# Patient Record
Sex: Male | Born: 1975
Health system: Southern US, Community
[De-identification: ages and names within clinical notes are randomized; demographics above are authoritative.]

## PROBLEM LIST (undated history)

## (undated) DIAGNOSIS — M109 Gout, unspecified: Secondary | ICD-10-CM

## (undated) HISTORY — PX: SCROTUM EXPLORATION: SHX2389

---

## 2014-01-06 ENCOUNTER — Ambulatory Visit (INDEPENDENT_AMBULATORY_CARE_PROVIDER_SITE_OTHER): Payer: Commercial Managed Care - PPO | Admitting: Emergency Medicine

## 2014-01-06 VITALS — BP 118/80 | HR 105 | Temp 98.5°F | Resp 20 | Ht 71.0 in | Wt 170.0 lb

## 2014-01-06 DIAGNOSIS — M10072 Idiopathic gout, left ankle and foot: Secondary | ICD-10-CM

## 2014-01-06 DIAGNOSIS — M109 Gout, unspecified: Secondary | ICD-10-CM | POA: Insufficient documentation

## 2014-01-06 MED ORDER — INDOMETHACIN 25 MG PO CAPS: 25.0000 mg | ORAL_CAPSULE | Freq: Three times a day (TID) | ORAL | Status: DC

## 2014-01-06 MED ORDER — COLCHICINE 0.6 MG PO TABS: ORAL_TABLET | ORAL | Status: DC

## 2014-01-06 NOTE — Progress Notes (Signed)
[facility] [address] [place] [address] [phone]  Date:  01/06/2014   Name:  Richard Olson   DOB:  12-17-1975   MRN:  [id number]  PCP:  No primary care provider on file.    Chief Complaint: Foot Pain   History of Present Illness:  Richard Olson is a 39 y.o. very pleasant male patient who presents with the following:  Patient has a history of gout.  Last episode was 10 years ago.  Interval negative Now has pain and swelling of left ankle and midfoot. No history of injury or overuse Pain started Friday. No fever or chills No improvement with over the counter medications or other home remedies.  Denies other complaint or health concern today.   Patient Active Problem List   Diagnosis Date Noted  . Gout 01/06/2014    History reviewed. No pertinent past medical history.  History reviewed. No pertinent past surgical history.  History  Substance Use Topics  . Smoking status: Never Smoker   . Smokeless tobacco: Not on file  . Alcohol Use: 0.0 oz/week    0 Not specified per week     Comment: 3 drinks a week    Family History  Problem Relation Age of Onset  . Diabetes Father     No Known Allergies  Medication list has been reviewed and updated.  No current outpatient prescriptions on file prior to visit.   No current facility-administered medications on file prior to visit.    Review of Systems:  As per HPI, otherwise negative.    Physical Examination: Filed Vitals:   01/06/14 1509  BP: 118/80  Pulse: 105  Temp: 98.5 F (36.9 C)  Resp: 20   Filed Vitals:   01/06/14 1509  Height:  (1.803 m)  Weight: 170 lb (77.111 kg)   Body mass index is 23.72 kg/(m^2). Ideal Body Weight: Weight in (lb) to have BMI = 25: 178.9   GEN: WDWN, NAD, Non-toxic, Alert & Oriented x 3 HEENT: Atraumatic, Normocephalic.  Ears and Nose: No external deformity. EXTR: No clubbing/cyanosis/edema NEURO: Normal gait.  PSYCH: Normally  interactive. Conversant. Not depressed or anxious appearing.  Calm demeanor.  Left foot swollen ankle and midfoot.  No erythema.  Some tenderness.  Assessment and Plan: Gout colcrys indocing  Signed,  Phillips Odor, MD

## 2014-01-06 NOTE — Patient Instructions (Addendum)
Gout Gout is an inflammatory arthritis caused by a buildup of uric acid crystals in the joints. Uric acid is a chemical that is normally present in the blood. When the level of uric acid in the blood is too high it can form crystals that deposit in your joints and tissues. This causes joint redness, soreness, and swelling (inflammation). Repeat attacks are common. Over time, uric acid crystals can form into masses (tophi) near a joint, destroying bone and causing disfigurement. Gout is treatable and often preventable. CAUSES  The disease begins with elevated levels of uric acid in the blood. Uric acid is produced by your body when it breaks down a naturally found substance called purines. Certain foods you eat, such as meats and fish, contain high amounts of purines. Causes of an elevated uric acid level include:  Being passed down from parent to child (heredity).  Diseases that cause increased uric acid production (such as obesity, psoriasis, and certain cancers).  Excessive alcohol use.  Diet, especially diets rich in meat and seafood.  Medicines, including certain cancer-fighting medicines (chemotherapy), water pills (diuretics), and aspirin.  Chronic kidney disease. The kidneys are no longer able to remove uric acid well.  Problems with metabolism. Conditions strongly associated with gout include: 1. Obesity. 2. High blood pressure. 3. High cholesterol. 4. Diabetes. Not everyone with elevated uric acid levels gets gout. It is not understood why some people get gout and others do not. Surgery, joint injury, and eating too much of certain foods are some of the factors that can lead to gout attacks. SYMPTOMS   An attack of gout comes on quickly. It causes intense pain with redness, swelling, and warmth in a joint.  Fever can occur.  Often, only one joint is involved. Certain joints are more commonly involved:  Base of the big toe.  Knee.  Ankle.  Wrist.  Finger. Without  treatment, an attack usually goes away in a few days to weeks. Between attacks, you usually will not have symptoms, which is different from many other forms of arthritis. DIAGNOSIS  Your caregiver will suspect gout based on your symptoms and exam. In some cases, tests may be recommended. The tests may include:  Blood tests.  Urine tests.  X-rays.  Joint fluid exam. This exam requires a needle to remove fluid from the joint (arthrocentesis). Using a microscope, gout is confirmed when uric acid crystals are seen in the joint fluid. TREATMENT  There are two phases to gout treatment: treating the sudden onset (acute) attack and preventing attacks (prophylaxis).  Treatment of an Acute Attack.  Medicines are used. These include anti-inflammatory medicines or steroid medicines.  An injection of steroid medicine into the affected joint is sometimes necessary.  The painful joint is rested. Movement can worsen the arthritis.  You may use warm or cold treatments on painful joints, depending which works best for you.  Treatment to Prevent Attacks.  If you suffer from frequent gout attacks, your caregiver may advise preventive medicine. These medicines are started after the acute attack subsides. These medicines either help your kidneys eliminate uric acid from your body or decrease your uric acid production. You may need to stay on these medicines for a very long time.  The early phase of treatment with preventive medicine can be associated with an increase in acute gout attacks. For this reason, during the first few months of treatment, your caregiver may also advise you to take medicines usually used for acute gout treatment. Be sure you   understand your caregiver's directions. Your caregiver may make several adjustments to your medicine dose before these medicines are effective.  Discuss dietary treatment with your caregiver or dietitian. Alcohol and drinks high in sugar and fructose and foods  such as meat, poultry, and seafood can increase uric acid levels. Your caregiver or dietitian can advise you on drinks and foods that should be limited. HOME CARE INSTRUCTIONS   Do not take aspirin to relieve pain. This raises uric acid levels.  Only take over-the-counter or prescription medicines for pain, discomfort, or fever as directed by your caregiver.  Rest the joint as much as possible. When in bed, keep sheets and blankets off painful areas.  Keep the affected joint raised (elevated).  Apply warm or cold treatments to painful joints. Use of warm or cold treatments depends on which works best for you.  Use crutches if the painful joint is in your leg.  Drink enough fluids to keep your urine clear or pale yellow. This helps your body get rid of uric acid. Limit alcohol, sugary drinks, and fructose drinks.  Follow your dietary instructions. Pay careful attention to the amount of protein you eat. Your daily diet should emphasize fruits, vegetables, whole grains, and fat-free or low-fat milk products. Discuss the use of coffee, vitamin C, and cherries with your caregiver or dietitian. These may be helpful in lowering uric acid levels.  Maintain a healthy body weight. SEEK MEDICAL CARE IF:   You develop diarrhea, vomiting, or any side effects from medicines.  You do not feel better in 24 hours, or you are getting worse. SEEK IMMEDIATE MEDICAL CARE IF:   Your joint becomes suddenly more tender, and you have chills or a fever. MAKE SURE YOU:   Understand these instructions.  Will watch your condition.  Will get help right away if you are not doing well or get worse. Document Released: 12/18/1999 Document Revised: 05/06/2013 Document Reviewed: 08/03/2011 Exit[employer] Patient Information 2015 [employer]. This information is not intended to replace advice given to you by your health care provider. Make sure you discuss any questions you have with your health care  provider. ++++++++++++++++++++++++++++++++++++++++++++  Information for patients with Gout  Gout defined-Gout occurs when urate crystals accumulate in your joint causing the inflammation and intense pain of gout attack.  Urate crystals can form when you have high levels of uric acid in your blood.  Your body produces uric acid when it breaks down prurines-substances that are found naturally in your body, as well as in certain foods such as organ meats, anchioves, herring, asparagus, and mushrooms.  Normally uric acid dissolves in your blood and passes through your kidneys into your urine.  But sometimes your body either produces too much uric acid or your kidneys excrete too little uric acid.  When this happens, uric acid can build up, forming sharp needle-like urate crystals in a joint or surrounding tissue that cause pain, inflammation and swelling.    Gout is characterized by sudden, severe attacks of pain, redness and tenderness in joints, often the joint at the base of the big toe.  Gout is complex form of arthritis that can affect anyone.  Men are more likely to get gout but women become increasingly more susceptible to gout after menopause.  An acute attack of gout can wake you up in the middle of the night with the sensation that your big toe is on fire.  The affected joint is hot, swollen and so tender that even the weight   or the sheet on it may seem intolerable.  If you experience symptoms of an acute gout attack it is important to your doctor as soon as the symptoms start.  Gout that goes untreated can lead to worsening pain and joint damage.  Risk Factors:  You are more likely to develop gout if you have high levels of uric acid in your body.    Factors that increase the uric acid level in your body include:  Lifestyle factors.  Excessive alcohol use-generally more than two drinks a day for men and more than one for women increase the risk of gout.  Medical conditions.  Certain  conditions make it more likely that you will develop gout.  These include hypertension, and chronic conditions such as diabetes, high levels of fat and cholesterol in the blood, and narrowing of the arteries.  Certain medications.  The uses of Thiazide diuretics- commonly used to treat hypertension and low dose aspirin can also increase uric acid levels.  Family history of gout.  If other members of your family have had gout, you are more likely to develop the disease.  Age and sex. Gout occurs more often in men than it does in women, primarily because women tend to have lower uric acid levels than men do.  Men are more likely to develop gout earlier usually between the ages of 40-50- whereas women generally develop signs and symptoms after menopause.    Tests and diagnosis:  Tests to help diagnose gout may include:  Blood test.  Your doctor may recommend a blood test to measure the uric acid level in your blood .  Blood tests can be misleading, though.  Some people have high uric acid levels but never experience gout.  And some people have signs and symptoms of gout, but don't have unusual levels of uric acid in their blood.  Joint fluid test.  Your doctor may use a needle to draw fluid from your affected joint.  When examined under the microscope, your joint fluid may reveal urate crystals.  Treatment:  Treatment for gout usually involves medications.  What medications you and your doctor choose will be based on your current health and other medications you currently take.  Gout medications can be used to treat acute gout attacks and prevent future attacks as well as reduce your risk of complications from gout such as the development of tophi from urate crystal deposits.  Alternative medicine:   Certain foods have been studied for their potential to lower uric acid levels, including:  Coffee.  Studies have found an association between coffee drinking (regular and decaf) and lower uric  acid levels.  The evidence is not enough to encourage non-coffee drinkers to start, but it may give clues to new ways of treating gout in the future.  Vitamin C.  Supplements containing vitamin C may reduce the levels of uric acid in your blood.  However, vitamin as a treatment for gout. Don't assume that if a little vitamin C is good, than lots is better.  Megadoses of vitamin C may increase your bodies uric acid levels.  Cherries.  Cherries have been associated with lower levels of uric acid in studies, but it isn't clear if they have any effect on gout signs and symptoms.  Eating more cherries and other dar-colored fruits, such as blackberries, blueberries, purple grapes and raspberries, may be a safe way to support your gout treatment.    Lifestyle/Diet Recommendations:   Drink 8 to 16 cups (   about 2 to 4 liters) of fluid each day, with at least half being water.  Avoid alcohol  Eat a moderate amount of protein, preferably from healthy sources, such as low-fat or fat-free dairy, tofu, eggs, and nut butters.  Limit you daily intake of meat, fish, and poultry to 4 to 6 ounces.  Avoid high fat meats and desserts.  Decrease you intake of shellfish, beef, lamb, pork, eggs and cheese.  Choose a good source of vitamin C daily such as citrus fruits, strawberries, broccoli,  brussel sprouts, papaya, and cantaloupe.   Choose a good source of vitamin A every other day such as yellow fruits, or dark green/yellow vegetables.  Avoid drastic weigh reduction or fasting.  If weigh loss is desired lose it over a period of several months.  See "dietary considerations.." chart for specific food recommendations.  Dietary Considerations for people with Gout  Food with negligible purine content (0-15 mg of purine nitrogen per 100 grams food)  May use as desired except on calorie variations  Non fat milk Cocoa Cereals (except in list II) Hard candies  Buttermilk Carbonated drinks Vegetables (except in  list II) Sherbet  Coffee Fruits Sugar Honey  Tea Cottage Cheese Gelatin-jell-o Salt  Fruit juice Breads Angel food Cake   Herbs/spices Jams/Jellies Carnation Instant Breakfast    Foods that do not contain excessive purine content, but must be limited due to fat content  Cream Eggs Oil and Salad Dressing  Half and Half Peanut Butter Chocolate  Whole Milk Cakes Potato Chips  Butter Ice Cream Fried Foods  Cheese Nuts Waffles, pancakes   List II: Food with moderate purine content (50-150 mg of purine nitrogen per 100 grams of food)  Limit total amount each day to 5 oz. cooked Lean meat, other than those on list III   Poultry, other than those on list III Fish, other than those on list III   Seafood, other than those on list III  These foods may be used occasionally  Peas Lentils Bran  Spinach Oatmeal Dried Beans and Peas  Asparagus Wheat Germ Mushrooms   Additional information about meat choices  Choose fish and poultry, particularly without skin, often.  Select lean, well trimmed cuts of meat.  Avoid all fatty meats, bacon , sausage, fried meats, fried fish, or poultry, luncheon meats, cold cuts, hot dogs, meats canned or frozen in gravy, spareribs and frozen and packaged prepared meats.   List III: Foods with HIGH purine content / Foods to AVOID (150-800 mg of purine nitrogen per 100 grams of food)  Anchovies Herring Meat Broths  Liver Mackerel Meat Extracts  Kidney Scallops Meat Drippings  Sardines Wild Game Mincemeat  Sweetbreads Goose Gravy  Heart Tongue Yeast, baker's and brewers   Commercial soups made with any of the foods listed in List II or List III  In addition avoid all alcoholic beverages 

## 2014-11-15 ENCOUNTER — Other Ambulatory Visit: Payer: Self-pay | Admitting: Emergency Medicine

## 2014-11-18 ENCOUNTER — Ambulatory Visit (INDEPENDENT_AMBULATORY_CARE_PROVIDER_SITE_OTHER): Payer: Commercial Managed Care - PPO | Admitting: Family Medicine

## 2014-11-18 VITALS — BP 128/78 | HR 71 | Temp 98.3°F | Resp 18 | Ht 73.0 in | Wt 182.6 lb

## 2014-11-18 DIAGNOSIS — M25572 Pain in left ankle and joints of left foot: Secondary | ICD-10-CM

## 2014-11-18 DIAGNOSIS — M10072 Idiopathic gout, left ankle and foot: Secondary | ICD-10-CM

## 2014-11-18 DIAGNOSIS — M109 Gout, unspecified: Secondary | ICD-10-CM

## 2014-11-18 MED ORDER — INDOMETHACIN 25 MG PO CAPS: 25.0000 mg | ORAL_CAPSULE | Freq: Three times a day (TID) | ORAL | Status: DC

## 2014-11-18 MED ORDER — COLCHICINE 0.6 MG PO TABS: ORAL_TABLET | ORAL | Status: DC

## 2014-11-18 NOTE — Patient Instructions (Signed)

## 2014-11-18 NOTE — Progress Notes (Signed)
Chief Complaint:  Chief Complaint  Patient presents with  . Gout    3 months ago    HPI: Richard Olson is a 39 y.o. male who reports to [facility] today complaining of left gouty flare up, he states it is on the top of his left foot.  He denies any fevers or chills. He feels pain when he has a sheet over it, and tingling. He denies having pain when he runs. He is training for a marathon but has not run i n a while due to pain. Feels similar to prior gout atack. He densi any priro stress fractures. He does not want any xrays or expensive workup at this tim. His father and brother have gout. He has had about 1-2 flare up a year, does not want to be on any daily gout meds.   History reviewed. No pertinent past medical history. History reviewed. No pertinent past surgical history. Social History   Social History  . Marital Status: Married    Spouse Name: N/A  . Number of Children: N/A  . Years of Education: N/A   Social History Main Topics  . Smoking status: Never Smoker   . Smokeless tobacco: None  . Alcohol Use: 0.0 oz/week    0 Standard drinks or equivalent per week     Comment: 3 drinks a week  . Drug Use: No  . Sexual Activity: Not Asked   Other Topics Concern  . None   Social History Narrative   Family History  Problem Relation Age of Onset  . Diabetes Father    No Known Allergies Prior to Admission medications   Medication Sig Start Date End Date Taking? Authorizing Provider  colchicine 0.6 MG tablet 2 now and one in one hour.  Tomorrow one daily 01/06/14  Yes Carmelina Dane, MD  indomethacin (INDOCIN) 25 MG capsule Take 1 capsule (25 mg total) by mouth 3 (three) times daily with meals. 01/06/14  Yes Carmelina Dane, MD     ROS: The patient denies fevers, chills, night sweats, unintentional weight loss, chest pain, palpitations, wheezing, dyspnea on exertion, nausea, vomiting, abdominal pain, dysuria, hematuria, melena, numbness, weakness, or tingling.    All other systems have been reviewed and were otherwise negative with the exception of those mentioned in the HPI and as above.    PHYSICAL EXAM: Filed Vitals:   11/18/14 1734  BP: 128/78  Pulse: 71  Temp: 98.3 F (36.8 C)  Resp: 18   Body mass index is 24.1 kg/(m^2).   General: Alert, no acute distress HEENT:  Normocephalic, atraumatic, oropharynx patent. EOMI, PERRLA Cardiovascular:  Regular rate and rhythm, no rubs murmurs or gallops.   Respiratory: Clear to auscultation bilaterally.  No wheezes, rales, or rhonchi.  No cyanosis, no use of accessory musculature Abdominal: No organomegaly, abdomen is soft and non-tender, positive bowel sounds. No masses. Skin: No rashes. Neurologic: Facial musculature symmetric. Psychiatric: Patient acts appropriately throughout our interaction. Lymphatic: No cervical or submandibular lymphadenopathy Musculoskeletal: Gait intact. + minimal edema, tenderness, no redness , no warmth top of left foot + DP and PTA,  Full ROM, 2/2 DTRs   LABS: No results found for this or any previous visit.   EKG/XRAY:   Primary read interpreted by Dr. Conley Rolls at [facility].   ASSESSMENT/PLAN: Encounter Diagnoses  Name Primary?  . Acute gout of left foot, unspecified cause Yes  . 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   Gross sideeffects, risk and benefits, and alternatives of medications d/w patient. Patient is aware that all medications have potential sideeffects and we are unable to predict every sideeffect or drug-drug interaction that may occur.  Leyla Soliz DO  11/18/2014 7:13 PM

## 2015-07-14 ENCOUNTER — Ambulatory Visit (INDEPENDENT_AMBULATORY_CARE_PROVIDER_SITE_OTHER): Payer: Commercial Managed Care - PPO

## 2015-07-14 ENCOUNTER — Ambulatory Visit (INDEPENDENT_AMBULATORY_CARE_PROVIDER_SITE_OTHER): Payer: Commercial Managed Care - PPO | Admitting: Urgent Care

## 2015-07-14 VITALS — BP 128/80 | HR 80 | Temp 98.0°F | Resp 17 | Ht 73.0 in | Wt 182.0 lb

## 2015-07-14 DIAGNOSIS — Z8639 Personal history of other endocrine, nutritional and metabolic disease: Secondary | ICD-10-CM | POA: Diagnosis not present

## 2015-07-14 DIAGNOSIS — M25571 Pain in right ankle and joints of right foot: Secondary | ICD-10-CM

## 2015-07-14 DIAGNOSIS — Z8739 Personal history of other diseases of the musculoskeletal system and connective tissue: Secondary | ICD-10-CM

## 2015-07-14 LAB — POCT CBC
GRANULOCYTE PERCENT: 66.9 % (ref 37–80)
HEMATOCRIT: 46.2 % (ref 43.5–53.7)
Hemoglobin: 16.7 g/dL (ref 14.1–18.1)
Lymph, poc: 2 (ref 0.6–3.4)
MCH, POC: 31.2 pg (ref 27–31.2)
MCHC: 36.2 g/dL — AB (ref 31.8–35.4)
MCV: 86.1 fL (ref 80–97)
MID (CBC): 0.7 (ref 0–0.9)
MPV: 8.1 fL (ref 0–99.8)
PLATELET COUNT, POC: 213 10*3/uL (ref 142–424)
POC Granulocyte: 5.4 (ref 2–6.9)
POC LYMPH PERCENT: 24.3 %L (ref 10–50)
POC MID %: 8.8 %M (ref 0–12)
RBC: 5.37 M/uL (ref 4.69–6.13)
RDW, POC: 12.1 %
WBC: 8.1 10*3/uL (ref 4.6–10.2)

## 2015-07-14 LAB — URIC ACID: URIC ACID, SERUM: 7.4 mg/dL (ref 4.0–8.0)

## 2015-07-14 MED ORDER — INDOMETHACIN 50 MG PO CAPS: 50.0000 mg | ORAL_CAPSULE | Freq: Three times a day (TID) | ORAL | Status: DC

## 2015-07-14 NOTE — Progress Notes (Signed)
MRN: [id number] DOB: [birth year]  Subjective:   Richard Olson is a 40 y.o. male with pmh gout presenting for chief complaint of Medication Refill  Reports 1 week history of right ankle pain. Patient has had difficulty with sleeping. Has tried colchicine and indomethacin with some relief. This was his previous regimen and the combination has helped some. He was [facility] and [facility], labs were done >10 years ago, none since then. He is also not interested in Allopurinol for prevention. Has "gout flares" once per year. However, he also runs heavily, trains for marathons. Denies fever, redness, swelling, trauma, falls.  Richard Olson has a current medication list which includes the following prescription(s): colchicine and indomethacin. Also has No Known Allergies.  Richard Olson  has no past medical history on file. Also  has no past surgical history on file.  His family history includes Diabetes in his father.   Objective:   Vitals: BP 128/80 mmHg  Pulse 80  Temp(Src) 98 F (36.7 C) (Oral)  Resp 17  Ht 6\' 1"  (1.854 m)  Wt 182 lb (82.555 kg)  BMI 24.02 kg/m2  SpO2 97%  Physical Exam  Constitutional: He is oriented to person, place, and time. He appears well-developed and well-nourished.  Cardiovascular: Normal rate.   Pulmonary/Chest: Effort normal.  Musculoskeletal:       Right foot: There is tenderness (over area outlined) and bony tenderness. There is normal range of motion, no swelling, normal capillary refill, no crepitus, no deformity and no laceration.       Feet:  Neurological: He is alert and oriented to person, place, and time. He has normal reflexes.  Skin: Skin is warm and dry.   Results for orders placed or performed in visit on 07/14/15 (from the past 24 hour(s))  POCT CBC     Status: Abnormal   Collection Time: 07/14/15 10:20 AM  Result Value Ref Range   WBC 8.1 4.6 - 10.2 K/uL   Lymph, poc 2.0 0.6 - 3.4   POC LYMPH PERCENT 24.3 10 - 50  %L   MID (cbc) 0.7 0 - 0.9   POC MID % 8.8 0 - 12 %M   POC Granulocyte 5.4 2 - 6.9   Granulocyte percent 66.9 37 - 80 %G   RBC 5.37 4.69 - 6.13 M/uL   Hemoglobin 16.7 14.1 - 18.1 g/dL   HCT, POC 40.946.2 81.143.5 - 53.7 %   MCV 86.1 80 - 97 fL   MCH, POC 31.2 27 - 31.2 pg   MCHC 36.2 (A) 31.8 - 35.4 g/dL   RDW, POC 91.412.1 %   Platelet Count, POC 213 142 - 424 K/uL   MPV 8.1 0 - 99.8 fL   Dg Ankle Complete Right  07/14/2015  CLINICAL DATA:  History of gout ; increased right ankle pain yesterday EXAM: RIGHT ANKLE - COMPLETE 3+ VIEW COMPARISON:  None in PACs FINDINGS: The bones are subjectively well mineralized. There is no acute or healing fracture nor dislocation. The joint mortise is preserved. The talar dome is intact. No osteochondral lesions are observed. The soft tissues exhibit no significant swelling. IMPRESSION: No acute or significant chronic bony abnormality of the right ankle. Electronically Signed   By: Neale  Swazil[place]Jordan M.D.   On: 07/14/2015 10:24    Assessment and Plan :   1. Pain in joint, ankle and foot, right 2. History of gout - Labs pending, x-ray findings, cbc reassuring. I suspect he has an inflammatory process  secondary to overuse, marathon training. Due to his concerns and family history with gout we will use indomethacin for gout attack and/or NSAID properties in the likelihood that his foot pain is just due to overuse. Patient agreed. RTC in 1 week if no improvement.  Wallis Bamberg, PA-C [facility] [phone] 07/14/2015 9:51 AM

## 2015-07-14 NOTE — Patient Instructions (Addendum)
Use ice baths for your feet after long training days. Do this for at least 10 minutes.   Indomethacin capsules What is this medicine? INDOMETHACIN (in doe METH a sin) is a non-steroidal anti-inflammatory drug (NSAID). It is used to reduce swelling and to treat pain. It may be used for painful joint and muscular problems such as arthritis, tendinitis, bursitis, and gout. This medicine may be used for other purposes; ask your health care provider or pharmacist if you have questions. What should I tell my health care provider before I take this medicine? They need to know if you have any of these conditions: -asthma, especially aspirin sensitive asthma -coronary artery bypass graft (CABG) surgery within the past 2 weeks -depression -drink more than 3 alcohol containing drinks a day -heart disease or circulation problems like heart failure or leg edema (fluid retention) -high blood pressure -kidney disease -liver disease -Parkinson's disease -seizures -stomach bleeding or ulcers -an unusual or allergic reaction to indomethacin, aspirin, other NSAIDs, other medicines, foods, dyes, or preservatives -pregnant or trying to get pregnant -breast-feeding How should I use this medicine? Take this medicine by mouth with food and with a full glass of water. Follow the directions on the prescription label. Take your medicine at regular intervals. Do not take your medicine more often than directed. Long-term, continuous use may increase the risk of heart attack or stroke. A special MedGuide will be given to you by the pharmacist with each prescription and refill. Be sure to read this information carefully each time. Talk to your pediatrician regarding the use of this medicine in children. Special care may be needed. While this drug may be prescribed for children as young as 15 years for selected conditions, precautions do apply. Elderly patients over 54 years old may have a stronger reaction and need a  smaller dose. Overdosage: If you think you have taken too much of this medicine contact a poison control center or emergency room at once. NOTE: This medicine is only for you. Do not share this medicine with others. What if I miss a dose? If you miss a dose, take it as soon as you can. If it is almost time for your next dose, take only that dose. Do not take double or extra doses. What may interact with this medicine? Do not take this medicine with any of the following medications: -cidofovir -diflunisal -ketorolac -methotrexate -pemetrexed -triamterene This medicine may also interact with the following medications: -alcohol -antacids -aspirin and aspirin-like medicines -cyclosporine -digoxin -diuretics -lithium -medicines for diabetes -medicines for high blood pressure -medicines that affect platelets -medicines that treat or prevent blood clots like warfarin -NSAIDs, medicines for pain and inflammation, like ibuprofen or naproxen -probenecid -steroid medicines like prednisone or cortisone This list may not describe all possible interactions. Give your health care provider a list of all the medicines, herbs, non-prescription drugs, or dietary supplements you use. Also tell them if you smoke, drink alcohol, or use illegal drugs. Some items may interact with your medicine. What should I watch for while using this medicine? Tell your doctor or health care professional if your pain does not get better. Talk to your doctor before taking another medicine for pain. Do not treat yourself. This medicine does not prevent heart attack or stroke. In fact, this medicine may increase the chance of a heart attack or stroke. The chance may increase with longer use of this medicine and in people who have heart disease. If you take aspirin to prevent heart attack  or stroke, talk with your doctor or health care professional. Do not take medicines such as ibuprofen and naproxen with this medicine. Side  effects such as stomach upset, nausea, or ulcers may be more likely to occur. Many medicines available without a prescription should not be taken with this medicine. This medicine can cause ulcers and bleeding in the stomach and intestines at any time during treatment. Do not smoke cigarettes or drink alcohol. These increase irritation to your stomach and can make it more susceptible to damage from this medicine. Ulcers and bleeding can happen without warning symptoms and can cause death. You may get drowsy or dizzy. Do not drive, use machinery, or do anything that needs mental alertness until you know how this medicine affects you. Do not stand or sit up quickly, especially if you are an older patient. This reduces the risk of dizzy or fainting spells. This medicine can cause you to bleed more easily. Try to avoid damage to your teeth and gums when you brush or floss your teeth. What side effects may I notice from receiving this medicine? Side effects that you should report to your doctor or health care professional as soon as possible: -allergic reactions like skin rash, itching or hives, swelling of the face, lips, or tongue -difficulty breathing or wheezing -nausea, vomiting -signs and symptoms of bleeding such as bloody or black, tarry stools; red or dark-brown urine; spitting up blood or brown material that looks like coffee grounds; red spots on the skin; unusual bruising or bleeding from the eye, gums, or nose -signs and symptoms of a blood clot such as changes in vision; chest pain; severe, sudden headache; trouble speaking; sudden numbness or weakness of the face, arm, or leg; trouble walking -unexplained weight gain or swelling -unusually weak or tired -yellowing of eyes or skin Side effects that usually do not require medical attention (report to your doctor or health care professional if they continue or are bothersome): -diarrhea -dizziness -headache -heartburn This list may not  describe all possible side effects. Call your doctor for medical advice about side effects. You may report side effects to FDA at 1-800-FDA-1088. Where should I keep my medicine? Keep out of the reach of children. Store at room temperature between 15 and 30 degrees C (59 and 86 degrees F). Keep container tightly closed. Throw away any unused medicine after the expiration date. NOTE: This sheet is a summary. It may not cover all possible information. If you have questions about this medicine, talk to your doctor, pharmacist, or health care provider.    2016, [employer]vier/Gold Standard. (2012-05-08 15:28:44)     IF you received an x-ray today, you will receive an invoice from [facility] Please contact [facility] at [phone] with questions or concerns regarding your invoice.   IF you received labwork today, you will receive an invoice from [facility]. Please contact [facility] at [phone] with questions or concerns regarding your invoice.   Our billing staff will not be able to assist you with questions regarding bills from these companies.  You will be contacted with the lab results as soon as they are available. The fastest way to get your results is to activate your My Chart account. Instructions are located on the last page of this paperwork. If you have not heard from us regarding the results in 2 weeks, please contact this office.

## 2015-07-15 ENCOUNTER — Other Ambulatory Visit: Payer: Self-pay | Admitting: Urgent Care

## 2015-07-15 ENCOUNTER — Encounter: Payer: Self-pay | Admitting: Urgent Care

## 2016-01-03 ENCOUNTER — Inpatient Hospital Stay (HOSPITAL_COMMUNITY)
Admission: AD | Admit: 2016-01-03 | Discharge: 2016-01-03 | Disposition: A | Discharge status: Home / Self Care | Payer: Commercial Managed Care - PPO | Source: Ambulatory Visit | Attending: Obstetrics and Gynecology | Admitting: Obstetrics and Gynecology

## 2016-01-03 DIAGNOSIS — R75 Inconclusive laboratory evidence of human immunodeficiency virus [HIV]: Secondary | ICD-10-CM | POA: Insufficient documentation

## 2016-01-03 LAB — RAPID HIV SCREEN (HIV 1/2 AB+AG)
HIV 1/2 ANTIBODIES: NONREACTIVE
HIV-1 P24 Antigen - HIV24: NONREACTIVE

## 2017-03-19 ENCOUNTER — Encounter (HOSPITAL_COMMUNITY): Payer: Self-pay | Admitting: Emergency Medicine

## 2017-03-19 ENCOUNTER — Ambulatory Visit (HOSPITAL_COMMUNITY)
Admission: EM | Admit: 2017-03-19 | Discharge: 2017-03-19 | Disposition: A | Discharge status: Home / Self Care | Payer: Commercial Managed Care - PPO | Attending: Emergency Medicine | Admitting: Emergency Medicine

## 2017-03-19 ENCOUNTER — Other Ambulatory Visit: Payer: Self-pay

## 2017-03-19 DIAGNOSIS — M109 Gout, unspecified: Secondary | ICD-10-CM | POA: Diagnosis not present

## 2017-03-19 HISTORY — DX: Gout, unspecified: M10.9

## 2017-03-19 MED ORDER — INDOMETHACIN 50 MG PO CAPS: 50.0000 mg | ORAL_CAPSULE | Freq: Three times a day (TID) | ORAL | 6 refills | Status: DC

## 2017-03-19 MED ORDER — COLCHICINE 0.6 MG PO TABS: ORAL_TABLET | ORAL | 0 refills | Status: DC

## 2017-03-19 NOTE — ED Provider Notes (Signed)
[facility]    CSN: [id number] Arrival date & time: 03/19/17  1608     History   Chief Complaint Chief Complaint  Patient presents with  . Toe Pain    HPI ESGAR BARNICK is a 42 y.o. male history of gout presenting today with gout flare.  He has had pain in the base of his right big toe for approximately 4 days now.  He does not take anything for prevention.  Has approximately 2-3 flares a year.  States that it is typically in his right foot, but will change in place.  Has previously used indomethacin, ran out on Friday.  Previously used colchicine although it did cause some diarrhea.  Requesting to try this again.  HPI  Past Medical History:  Diagnosis Date  . Gout     Patient Active Problem List   Diagnosis Date Noted  . Gout 01/06/2014    Past Surgical History:  Procedure Laterality Date  . SCROTUM EXPLORATION     ascending testicle       Home Medications    Prior to Admission medications   Medication Sig Start Date End Date Taking? Authorizing Provider  colchicine (COLCRYS) 0.6 MG tablet Please begin with 2 tablets, then 1 tablet 1 hour later. Repeat on day 2 if needed. 03/19/17   Wieters, Hallie C, PA-C  indomethacin (INDOCIN) 50 MG capsule Take 1 capsule (50 mg total) by mouth 3 (three) times daily with meals. 03/19/17   Wieters, Junius Creamer, PA-C    Family History Family History  Problem Relation Age of Onset  . Diabetes Father     Social History Social History   Tobacco Use  . Smoking status: Never Smoker  . Smokeless tobacco: Never Used  Substance Use Topics  . Alcohol use: Yes    Alcohol/week: 0.0 oz    Comment: 3 drinks a week  . Drug use: No     Allergies   Patient has no known allergies.   Review of Systems Review of Systems  Constitutional: Negative for fatigue and fever.  Respiratory: Negative for shortness of breath.   Cardiovascular: Negative for chest pain.  Gastrointestinal: Negative for abdominal pain, nausea and  vomiting.  Musculoskeletal: Positive for arthralgias and joint swelling.  Skin: Positive for color change.  Neurological: Negative for weakness, light-headedness, numbness and headaches.     Physical Exam Triage Vital Signs ED Triage Vitals  Enc Vitals Group     BP 03/19/17 1711 130/88     Pulse Rate 03/19/17 1711 85     Resp --      Temp 03/19/17 1711 97.6 F (36.4 C)     Temp Source 03/19/17 1711 Oral     SpO2 03/19/17 1711 97 %     Weight --      Height --      Head Circumference --      Peak Flow --      Pain Score 03/19/17 1706 7     Pain Loc --      Pain Edu? --      Excl. in GC? --    No data found.  Updated Vital Signs BP 130/88 (BP Location: Right Arm)   Pulse 85   Temp 97.6 F (36.4 C) (Oral)   SpO2 97%   Visual Acuity Right Eye Distance:   Left Eye Distance:   Bilateral Distance:    Right Eye Near:   Left Eye Near:    Bilateral Near:  Physical Exam  Constitutional: He appears well-developed and well-nourished.  HENT:  Head: Normocephalic and atraumatic.  Eyes: Conjunctivae are normal.  Neck: Neck supple.  Cardiovascular: Normal rate and regular rhythm.  No murmur heard. Pulmonary/Chest: Effort normal. No respiratory distress.  Abdominal: Soft. There is no tenderness.  Musculoskeletal: He exhibits edema and tenderness.  Base of right toe with swelling and erythema, tenderness to palpation, limited range of motion of toe due to pain.  Dorsalis pedis 2+.  Neurological: He is alert.  Skin: Skin is warm and dry.  Psychiatric: He has a normal mood and affect.  Nursing note and vitals reviewed.    UC Treatments / Results  Labs (all labs ordered are listed, but only abnormal results are displayed) Labs Reviewed - No data to display  EKG  EKG Interpretation None       Radiology No results found.  Procedures Procedures (including critical care time)  Medications Ordered in UC Medications - No data to display   Initial  Impression / Assessment and Plan / UC Course  I have reviewed the triage vital signs and the nursing notes.  Pertinent labs & imaging results that were available during my care of the patient were reviewed by me and considered in my medical decision making (see chart for details).     Patient with gout flare in right toe.  Will refill indomethacin as well as begin patient on colchicine.  Discussed that this will cause diarrhea, but should resolve after use. Discussed strict return precautions. Patient verbalized understanding and is agreeable with plan.   Final Clinical Impressions(s) / UC Diagnoses   Final diagnoses:  Acute gout involving toe of right foot, unspecified cause    ED Discharge Orders        Ordered    indomethacin (INDOCIN) 50 MG capsule  3 times daily with meals     03/19/17 1737    colchicine (COLCRYS) 0.6 MG tablet     03/19/17 1737       Controlled Substance Prescriptions [place] Controlled Substance Registry consulted? Not Applicable   Lew DawesWieters, Hallie C, [place]PA-C 03/19/17 1744

## 2017-03-19 NOTE — Discharge Instructions (Signed)
Please begin colchicine today.  Began with 2 tablets now, followed by 1 tablet 1 hour later.  Repeat this tomorrow.  I have also refilled your indomethacin to use as well.  Please return if symptoms not improving with treatment.

## 2017-03-19 NOTE — ED Triage Notes (Signed)
States having a flare-up of gout in right big toe onset Thursday

## 2019-04-28 ENCOUNTER — Emergency Department (HOSPITAL_COMMUNITY): Payer: Self-pay

## 2019-04-28 ENCOUNTER — Encounter (HOSPITAL_COMMUNITY): Payer: Self-pay | Admitting: Emergency Medicine

## 2019-04-28 ENCOUNTER — Emergency Department (HOSPITAL_COMMUNITY)
Admission: EM | Admit: 2019-04-28 | Discharge: 2019-04-28 | Disposition: A | Discharge status: Home / Self Care | Payer: Self-pay | Attending: Emergency Medicine | Admitting: Emergency Medicine

## 2019-04-28 ENCOUNTER — Other Ambulatory Visit: Payer: Self-pay

## 2019-04-28 DIAGNOSIS — N23 Unspecified renal colic: Secondary | ICD-10-CM | POA: Insufficient documentation

## 2019-04-28 DIAGNOSIS — N201 Calculus of ureter: Secondary | ICD-10-CM | POA: Insufficient documentation

## 2019-04-28 DIAGNOSIS — R1110 Vomiting, unspecified: Secondary | ICD-10-CM | POA: Insufficient documentation

## 2019-04-28 DIAGNOSIS — R197 Diarrhea, unspecified: Secondary | ICD-10-CM | POA: Insufficient documentation

## 2019-04-28 LAB — COMPREHENSIVE METABOLIC PANEL
ALT: 49 U/L — ABNORMAL HIGH (ref 0–44)
AST: 27 U/L (ref 15–41)
Albumin: 5 g/dL (ref 3.5–5.0)
Alkaline Phosphatase: 48 U/L (ref 38–126)
Anion gap: 11 (ref 5–15)
BUN: 20 mg/dL (ref 6–20)
CO2: 24 mmol/L (ref 22–32)
Calcium: 10.1 mg/dL (ref 8.9–10.3)
Chloride: 104 mmol/L (ref 98–111)
Creatinine, Ser: 1.87 mg/dL — ABNORMAL HIGH (ref 0.61–1.24)
GFR calc Af Amer: 51 mL/min — ABNORMAL LOW (ref >60)
GFR calc non Af Amer: 44 mL/min — ABNORMAL LOW (ref >60)
Glucose, Bld: 142 mg/dL — ABNORMAL HIGH (ref 70–99)
Potassium: 4.6 mmol/L (ref 3.5–5.1)
Sodium: 139 mmol/L (ref 135–145)
Total Bilirubin: 2.1 mg/dL — ABNORMAL HIGH (ref 0.3–1.2)
Total Protein: 8.1 g/dL (ref 6.5–8.1)

## 2019-04-28 LAB — CBC
HCT: 49.6 % (ref 39.0–52.0)
Hemoglobin: 16.8 g/dL (ref 13.0–17.0)
MCH: 29.8 pg (ref 26.0–34.0)
MCHC: 33.9 g/dL (ref 30.0–36.0)
MCV: 88.1 fL (ref 80.0–100.0)
Platelets: 277 10*3/uL (ref 150–400)
RBC: 5.63 MIL/uL (ref 4.22–5.81)
RDW: 12.1 % (ref 11.5–15.5)
WBC: 12.3 10*3/uL — ABNORMAL HIGH (ref 4.0–10.5)
nRBC: 0 % (ref 0.0–0.2)

## 2019-04-28 LAB — LIPASE, BLOOD: Lipase: 23 U/L (ref 11–51)

## 2019-04-28 MED ORDER — SODIUM CHLORIDE 0.9% FLUSH: 3.0000 mL | Freq: Once | INTRAVENOUS | Status: DC

## 2019-04-28 MED ORDER — HYDROCODONE-ACETAMINOPHEN 5-325 MG PO TABS: 2.0000 | ORAL_TABLET | ORAL | 0 refills | Status: DC | PRN

## 2019-04-28 MED ORDER — HYDROMORPHONE HCL 1 MG/ML IJ SOLN
1.0000 mg | Freq: Once | INTRAMUSCULAR | Status: AC
  Administered 2019-04-28: 1 mg via INTRAVENOUS
  Filled 2019-04-28: qty 1

## 2019-04-28 MED ORDER — HYDROCODONE-ACETAMINOPHEN 5-325 MG PO TABS: 1.0000 | ORAL_TABLET | ORAL | 0 refills | Status: DC | PRN

## 2019-04-28 MED ORDER — TAMSULOSIN HCL 0.4 MG PO CAPS: ORAL_CAPSULE | ORAL | 0 refills | Status: DC

## 2019-04-28 MED ORDER — SODIUM CHLORIDE 0.9 % IV SOLN: INTRAVENOUS | Status: DC

## 2019-04-28 NOTE — ED Provider Notes (Addendum)
[facility] DEPT Provider Note   CSN: [id number] Arrival date & time: 04/28/19  1218     History Chief Complaint  Patient presents with  . Abdominal Pain  . Diarrhea  . Emesis    Richard Olson is a 44 y.o. male.  HPI The patient reports onset of right-sided abdominal pain, lower, at 1:30 in the morning.  The pain has been persistent, waxing and waning somewhat.  He has had a single episode of vomiting.  He initially had a bout of diarrhea but that has not persisted with the pain.  He denies fever, chills, chest pain, cough or dizziness.  There are no other known moderate point factors.    Past Medical History:  Diagnosis Date  . Gout     Patient Active Problem List   Diagnosis Date Noted  . Gout 01/06/2014    Past Surgical History:  Procedure Laterality Date  . SCROTUM EXPLORATION     ascending testicle       Family History  Problem Relation Age of Onset  . Diabetes Father     Social History   Tobacco Use  . Smoking status: Never Smoker  . Smokeless tobacco: Never Used  Substance Use Topics  . Alcohol use: Yes    Alcohol/week: 0.0 standard drinks    Comment: 3 drinks a week  . Drug use: No    Home Medications Prior to Admission medications   Medication Sig Start Date End Date Taking? Authorizing Provider  colchicine (COLCRYS) 0.6 MG tablet Please begin with 2 tablets, then 1 tablet 1 hour later. Repeat on day 2 if needed. 03/19/17   Wieters, Hallie C, PA-C  indomethacin (INDOCIN) 50 MG capsule Take 1 capsule (50 mg total) by mouth 3 (three) times daily with meals. 03/19/17   Wieters, Hallie C, PA-C    Allergies    Patient has no known allergies.  Review of Systems   Review of Systems  All other systems reviewed and are negative.   Physical Exam Updated Vital Signs BP (!) 151/98 (BP Location: Left Arm)   Pulse 87   Temp (!) 97.5 F (36.4 C) (Oral)   Resp 19   SpO2 100%   Physical Exam Vitals and nursing  note reviewed.  Constitutional:      Appearance: He is well-developed.  HENT:     Head: Normocephalic and atraumatic.     Right Ear: External ear normal.     Left Ear: External ear normal.  Eyes:     Conjunctiva/sclera: Conjunctivae normal.     Pupils: Pupils are equal, round, and reactive to light.  Neck:     Trachea: Phonation normal.  Cardiovascular:     Rate and Rhythm: Normal rate.  Pulmonary:     Effort: Pulmonary effort is normal.  Abdominal:     General: There is no distension.     Palpations: Abdomen is soft. There is no mass.     Tenderness: There is abdominal tenderness (Right mid to lower abdomen, mild).     Hernia: No hernia is present.  Musculoskeletal:        General: Normal range of motion.     Cervical back: Normal range of motion and neck supple.  Skin:    General: Skin is warm and dry.  Neurological:     Mental Status: He is alert and oriented to person, place, and time.     Cranial Nerves: No cranial nerve deficit.     Sensory: No  sensory deficit.     Motor: No abnormal muscle tone.     Coordination: Coordination normal.  Psychiatric:        Mood and Affect: Mood normal.        Behavior: Behavior normal.        Thought Content: Thought content normal.        Judgment: Judgment normal.     ED Results / Procedures / Treatments   Labs (all labs ordered are listed, but only abnormal results are displayed) Labs Reviewed  COMPREHENSIVE METABOLIC PANEL - Abnormal; Notable for the following components:      Result Value   Glucose, Bld 142 (*)    Creatinine, Ser 1.87 (*)    ALT 49 (*)    Total Bilirubin 2.1 (*)    GFR calc non Af Amer 44 (*)    GFR calc Af Amer 51 (*)    All other components within normal limits  CBC - Abnormal; Notable for the following components:   WBC 12.3 (*)    All other components within normal limits  LIPASE, BLOOD  URINALYSIS, ROUTINE W REFLEX MICROSCOPIC    EKG None  Radiology No results found.  Procedures  Procedures (including critical care time)  Medications Ordered in ED Medications  sodium chloride flush (NS) 0.9 % injection 3 mL (has no administration in time range)    ED Course  I have reviewed the triage vital signs and the nursing notes.  Pertinent labs & imaging results that were available during my care of the patient were reviewed by me and considered in my medical decision making (see chart for details).  Clinical Course as of May 13 1746  Mon May 13, 2019  1747 Normal except glucose high, ALT high, total bilirubin high, GFR low  Comprehensive metabolic panel(!) [EW]  9357 Normal  Lipase, blood [EW]  1747 Normal except mild elevation of white count  CBC(!) [EW]    Clinical Course User Index [EW] Daleen Bo, MD   MDM Rules/Calculators/A&P                       Patient Vitals for the past 24 hrs:  BP Temp Temp src Pulse Resp SpO2  04/28/19 1700 130/90 - - 66 16 96 %  04/28/19 1630 130/73 - - 71 18 98 %  04/28/19 1544 (!) 134/97 - - 76 18 98 %  04/28/19 1226 (!) 151/98 (!) 97.5 F (36.4 C) Oral 87 19 100 %    5:16 PM Reevaluation with update and discussion. After initial assessment and treatment, an updated evaluation reveals he states his pain is resolved he has no further complaints.  Plan discussed with patient and his mother, who is with him, all questions answered. Daleen Bo   Medical Decision Making:  This patient is presenting for evaluation of right flank and lower abdominal pain, which does require a range of treatment options, and is a complaint that involves a high risk of morbidity and mortality. The differential diagnoses include urinary tract stone, appendicitis. I decided  to review old records, and in summary healthy patient with only gout in past, no prior stones or abdominal surgery. I did not require additional historical information from anyone. Clinical Laboratory Tests Ordered, included CBC, c met.  Normal except mild elevation of  creatinine, and total bilirubin and white count Radiologic Tests Ordered, included CT abdomen pelvis. I independently Visualized: CT images images, which show distal right ureteral stone small, with mild  hydro proximal  Critical Interventions-clinical evaluation, laboratory testing, medication therapy, reevaluation  After These Interventions, the Patient was reevaluated and was found comfortable, and stable for discharge  CRITICAL CARE-no Performed by: Daleen Bo   Nursing Notes Reviewed/ Care Coordinated Applicable Imaging Reviewed Interpretation of Laboratory Data incorporated into ED treatment  The patient appears reasonably screened and/or stabilized for discharge and I doubt any other medical condition or other [facility] requiring further screening, evaluation, or treatment in the ED at this time prior to discharge.  Plan: Home Medications-continue usual; Home Treatments-strain urine; return here if the recommended treatment, does not improve the symptoms; Recommended follow up-neurology follow-up 1 week    Final Clinical Impression(s) / ED Diagnoses Final diagnoses:  Ureteral colic  Right ureteral stone    Rx / DC Orders ED Discharge Orders    None       Daleen Bo, MD 04/28/19 1738    Daleen Bo, MD 05/13/19 [phone]

## 2019-04-28 NOTE — ED Notes (Signed)
Pt unable to void; will continue to monitor.  

## 2019-04-28 NOTE — Discharge Instructions (Signed)
The testing today shows that your pain is likely related to presence of a kidney stone on the right side, nearing entry into the bladder.  He also have signs of obstructive changes with mild elevation of your creatinine.  Have the urologist check your creatinine when you see them.  There was also incidental mild elevation of your total bilirubin which may be related to dehydration.  There is no urgent need to follow-up on this however, routine follow-up at your next appointment with your PCP would be beneficial.

## 2019-04-28 NOTE — ED Triage Notes (Signed)
Pt reports that around 130am he woke up having to use bathroom, reports diarrhea. Then started having RLQ pains. Reports vomited once. Pain has been constant with no relief.

## 2020-12-21 ENCOUNTER — Encounter (HOSPITAL_COMMUNITY): Payer: Self-pay

## 2020-12-21 ENCOUNTER — Ambulatory Visit: Payer: Self-pay

## 2020-12-21 ENCOUNTER — Ambulatory Visit (HOSPITAL_COMMUNITY)
Admission: EM | Admit: 2020-12-21 | Discharge: 2020-12-21 | Disposition: A | Discharge status: Home / Self Care | Payer: Commercial Managed Care - PPO | Attending: Urgent Care | Admitting: Urgent Care

## 2020-12-21 DIAGNOSIS — M10471 Other secondary gout, right ankle and foot: Secondary | ICD-10-CM

## 2020-12-21 DIAGNOSIS — R030 Elevated blood-pressure reading, without diagnosis of hypertension: Secondary | ICD-10-CM

## 2020-12-21 MED ORDER — INDOMETHACIN 50 MG PO CAPS: ORAL_CAPSULE | ORAL | 6 refills | Status: DC

## 2020-12-21 MED ORDER — COLCHICINE 0.6 MG PO TABS: ORAL_TABLET | ORAL | 0 refills | Status: DC

## 2020-12-21 MED ORDER — PREDNISONE 10 MG (21) PO TBPK: ORAL_TABLET | Freq: Every day | ORAL | 0 refills | Status: DC

## 2020-12-21 NOTE — ED Triage Notes (Signed)
Pt presented to the office for right foot pain that started yesterday. He thinks her may have gout.

## 2020-12-21 NOTE — ED Provider Notes (Signed)
[facility]    CSN: [id number] Arrival date & time: 12/21/20  0802      History   Chief Complaint Chief Complaint  Patient presents with   Gout    HPI Richard Olson is a 45 y.o. male.   45 year old male who presents today with acute onset of gout to his right foot.  He reports 2-3 episodes of this per year.  He does not drink alcohol and feels that his diet is relatively low in purines.  He does admit to being an extensive runner and ran 13 miles the day that it started.  He has taken colchicine and indomethacin in the past with good response.  Ran out of indomethacin has not taken anything for his current symptoms. Blood pressure also reviewed, patient denies history of hypertension.  States it runs in his family however.  Patient admits to eating a high salt diet.  Denies symptoms of hypertension in office today.    Past Medical History:  Diagnosis Date   Gout     Patient Active Problem List   Diagnosis Date Noted   Gout 01/06/2014    Past Surgical History:  Procedure Laterality Date   SCROTUM EXPLORATION     ascending testicle       Home Medications    Prior to Admission medications   Medication Sig Start Date End Date Taking? Authorizing Provider  predniSONE (STERAPRED UNI-PAK 21 TAB) 10 MG (21) TBPK tablet Take by mouth daily. Take 6 tabs by mouth daily  for 1 days, then 5 tabs for 1 days, then 4 tabs for 1 days, then 3 tabs for 1 days, 2 tabs for 1 days, then 1 tab by mouth daily for 1 days 12/21/20  Yes Jayvier Burgher L, PA  colchicine (COLCRYS) 0.6 MG tablet Please begin with 2 tablets, then 1 tablet 1 hour later. Repeat on day 2 if needed. 12/21/20   Salisa Broz L, PA  HYDROcodone-acetaminophen (NORCO/VICODIN) 5-325 MG tablet Take 1 tablet by mouth every 4 (four) hours as needed. 04/28/19   Mancel Bale, MD  indomethacin (INDOCIN) 50 MG capsule Take one capsule PO with food three times daily as needed AFTER completing prednisone pack  12/21/20   Guy Sandifer L, PA  tamsulosin (FLOMAX) 0.4 MG CAPS capsule 1 q HS to aid stone passage 04/28/19   Mancel Bale, MD    Family History Family History  Problem Relation Age of Onset   Diabetes Father     Social History Social History   Tobacco Use   Smoking status: Never   Smokeless tobacco: Never  Substance Use Topics   Alcohol use: Yes    Alcohol/week: 0.0 standard drinks    Comment: 3 drinks a week   Drug use: No     Allergies   Patient has no known allergies.   Review of Systems Review of Systems  Musculoskeletal:  Positive for arthralgias (R foot).  All other systems reviewed and are negative.   Physical Exam Triage Vital Signs ED Triage Vitals  Enc Vitals Group     BP 12/21/20 0820 (!) 158/102     Pulse Rate 12/21/20 0820 72     Resp 12/21/20 0820 18     Temp 12/21/20 0820 98.2 F (36.8 C)     Temp Source 12/21/20 0820 Oral     SpO2 12/21/20 0820 100 %     Weight --      Height --      Head Circumference --  Peak Flow --      Pain Score 12/21/20 0821 6     Pain Loc --      Pain Edu? --      Excl. in GC? --    No data found.  Updated Vital Signs BP (!) 158/102 (BP Location: Left Arm)    Pulse 72    Temp 98.2 F (36.8 C) (Oral)    Resp 18    SpO2 100%   Visual Acuity Right Eye Distance:   Left Eye Distance:   Bilateral Distance:    Right Eye Near:   Left Eye Near:    Bilateral Near:     Physical Exam Vitals and nursing note reviewed.  Constitutional:      General: He is not in acute distress.    Appearance: Normal appearance. He is normal weight. He is not ill-appearing or toxic-appearing.  HENT:     Head: Normocephalic.  Musculoskeletal:        General: Swelling (R foot) and tenderness (R foot) present. No deformity or signs of injury.     Cervical back: Normal range of motion.     Right lower leg: No edema.     Left lower leg: No edema.  Skin:    General: Skin is warm.     Findings: Erythema (to skin of foot on  R) present.  Neurological:     General: No focal deficit present.     Mental Status: He is alert and oriented to person, place, and time. Mental status is at baseline.     Cranial Nerves: No cranial nerve deficit.     Sensory: No sensory deficit.     Motor: No weakness.     Gait: Gait normal.     UC Treatments / Results  Labs (all labs ordered are listed, but only abnormal results are displayed) Labs Reviewed - No data to display  EKG   Radiology No results found.  Procedures Procedures (including critical care time)  Medications Ordered in UC Medications - No data to display  Initial Impression / Assessment and Plan / UC Course  I have reviewed the triage vital signs and the nursing notes.  Pertinent labs & imaging results that were available during my care of the patient were reviewed by me and considered in my medical decision making (see chart for details).     Gout, recurrent -treat today with colchicine and prednisone.  Indomethacin on an as-needed basis once prednisone completed.  Establish care with a PCP to have uric acid levels assessed, and possibly start allopurinol.  Preventative therapy not indicated during an acute flare. Elevated blood pressure -possibly secondary to #1.  Briefly discussed low-salt diet.  Monitor blood pressure at home with goal of 120/80  Final Clinical Impressions(s) / UC Diagnoses   Final diagnoses:  Other secondary acute gout of right foot  Elevated blood pressure reading in office without diagnosis of hypertension     Discharge Instructions      Take prednisone daily in the morning as discussed. Take colchicine today, repeat on an as needed basis for flare ups Only use indomethacin on an as needed basis for mild flares Monitor BP at home, goal reading 120/80. CUT BACK ON SALT! 2-3g daily is recommended. Establish care with a primary care physician for recheck.     ED Prescriptions     Medication Sig Dispense Auth.  Provider   indomethacin (INDOCIN) 50 MG capsule Take one capsule PO with food three times daily  as needed AFTER completing prednisone pack 30 capsule Coltyn Hanning L, PA   colchicine (COLCRYS) 0.6 MG tablet Please begin with 2 tablets, then 1 tablet 1 hour later. Repeat on day 2 if needed. 12 tablet Graciela Plato L, PA   predniSONE (STERAPRED UNI-PAK 21 TAB) 10 MG (21) TBPK tablet Take by mouth daily. Take 6 tabs by mouth daily  for 1 days, then 5 tabs for 1 days, then 4 tabs for 1 days, then 3 tabs for 1 days, 2 tabs for 1 days, then 1 tab by mouth daily for 1 days 21 tablet Seiya Silsby L, PA      PDMP not reviewed this encounter.   Maretta Bees, [place] 12/21/20 2155

## 2020-12-21 NOTE — Discharge Instructions (Addendum)
Take prednisone daily in the morning as discussed. Take colchicine today, repeat on an as needed basis for flare ups Only use indomethacin on an as needed basis for mild flares Monitor BP at home, goal reading 120/80. CUT BACK ON SALT! 2-3g daily is recommended. Establish care with a primary care physician for recheck.

## 2021-02-08 IMAGING — CT CT RENAL STONE PROTOCOL
2 of 4 series · 16 of 46 positions shown, 18 images · non-contrast
Comparison: None.

CLINICAL DATA: Right lower quadrant and flank pain, diarrhea,
vomiting

EXAM:
CT ABDOMEN AND PELVIS WITHOUT CONTRAST
TECHNIQUE: Multidetector CT imaging of the abdomen and pelvis was performed
following the standard protocol without IV contrast.

[Series 2: axial st · axial · 0.86mm/px · z∈[-544,-89]mm · 13 of 103 slices shown, 15 images]
[im 6/103  soft-tissue]
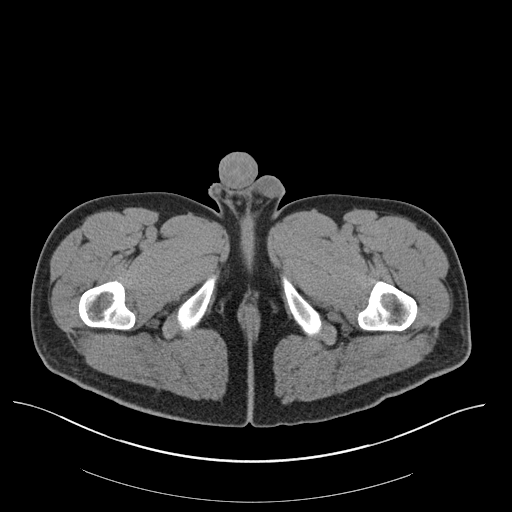
[im 6/103  bone]
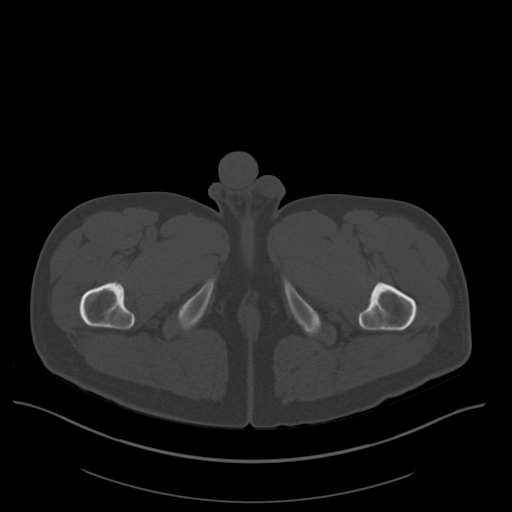
[im 12/103  soft-tissue]
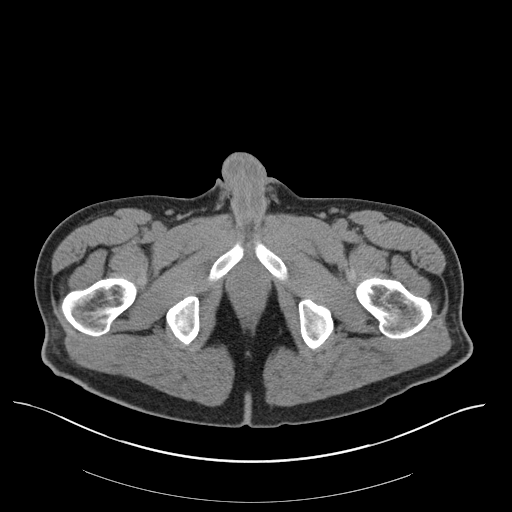
[im 23/103  soft-tissue]
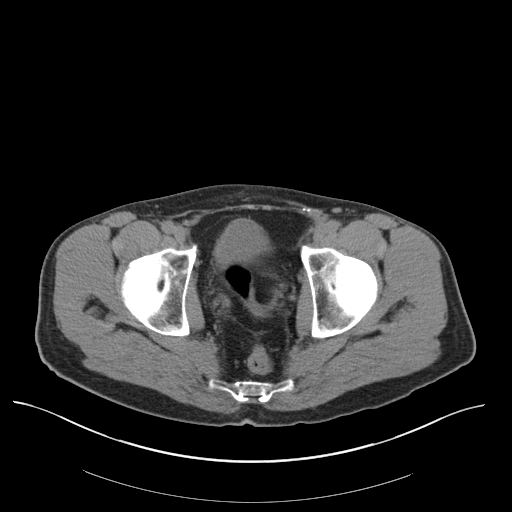
[im 29/103  soft-tissue]
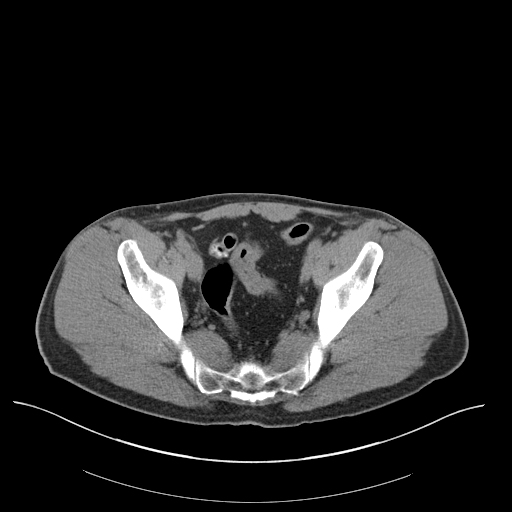
[im 35/103  soft-tissue]
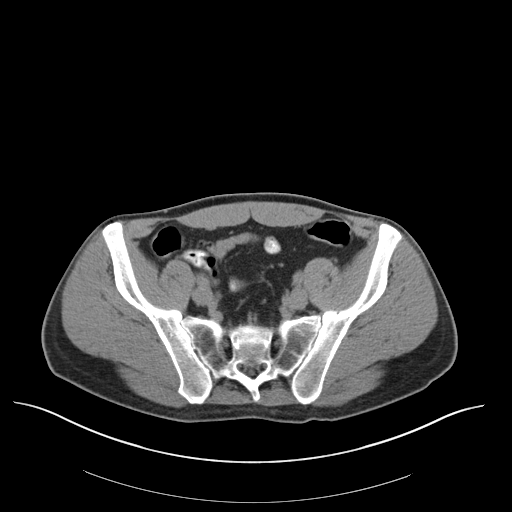
[im 46/103  soft-tissue]
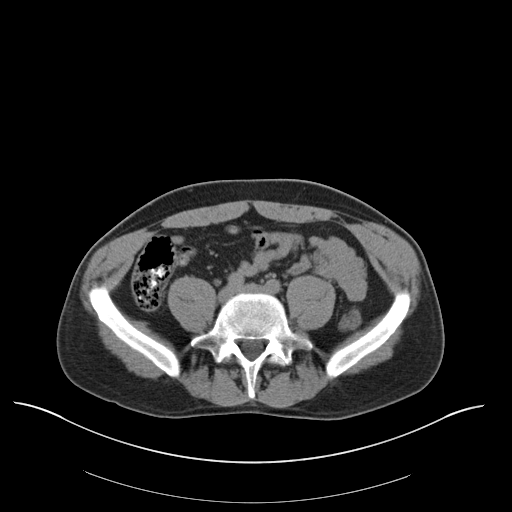
[im 52/103  soft-tissue]
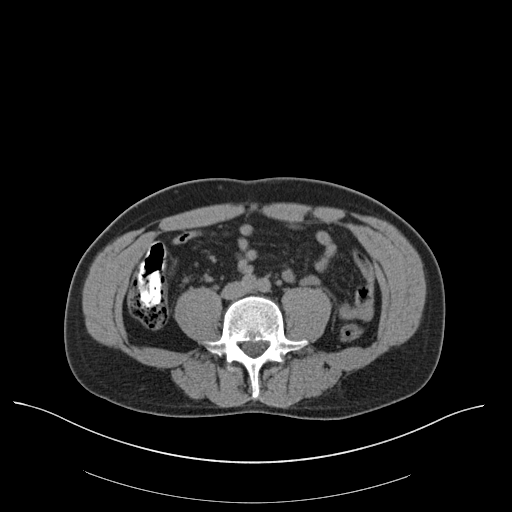
[im 57/103  soft-tissue]
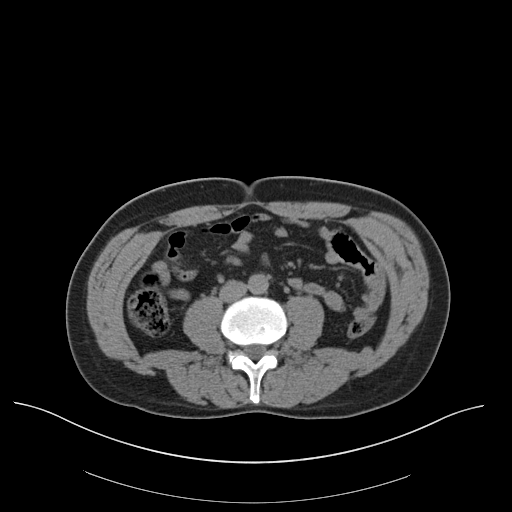
[im 69/103  soft-tissue]
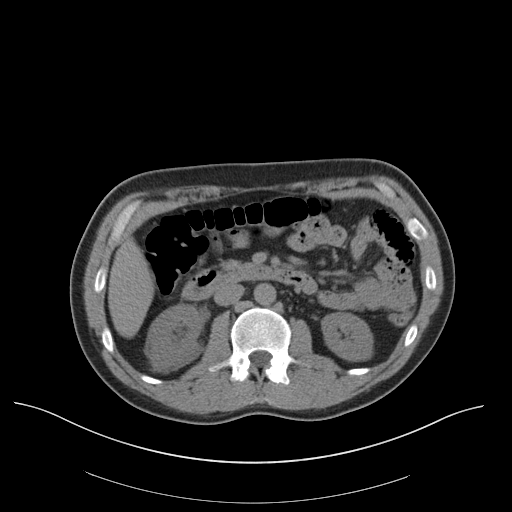
[im 69/103  bone]
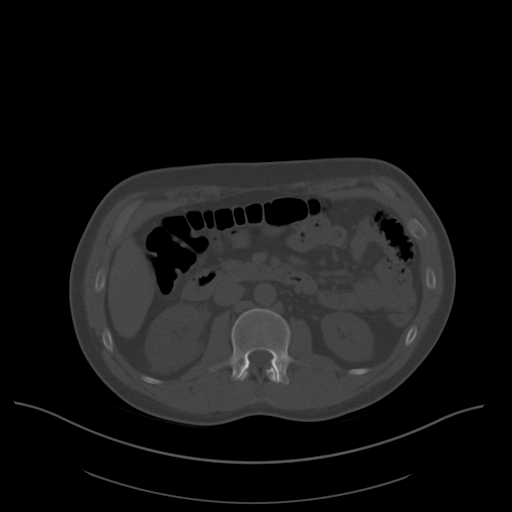
[im 74/103  soft-tissue]
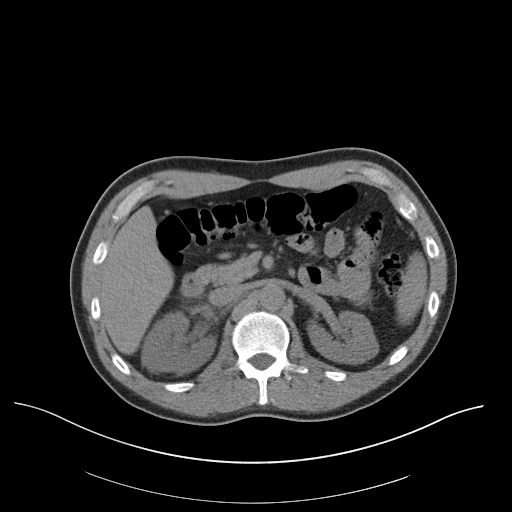
[im 80/103  soft-tissue]
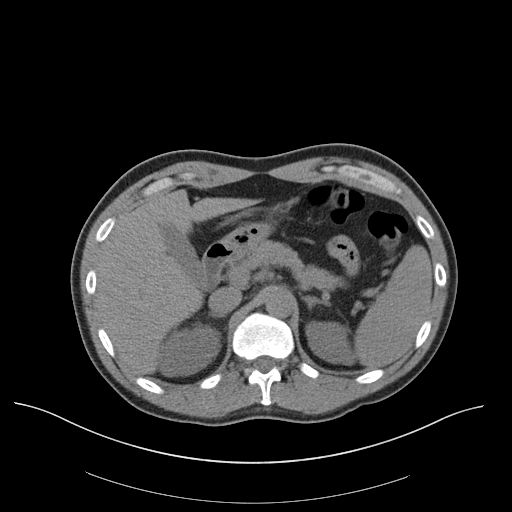
[im 91/103  soft-tissue]
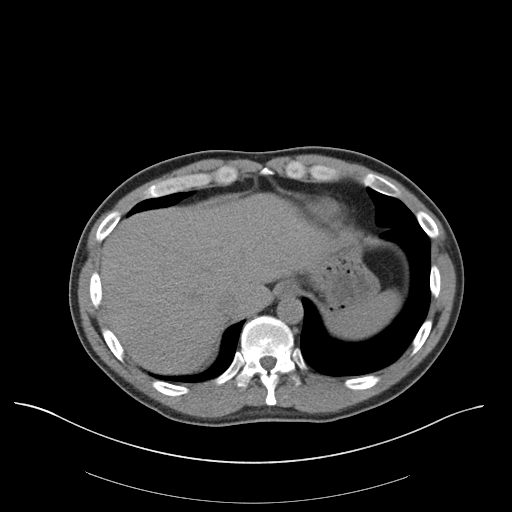
[im 97/103  soft-tissue]
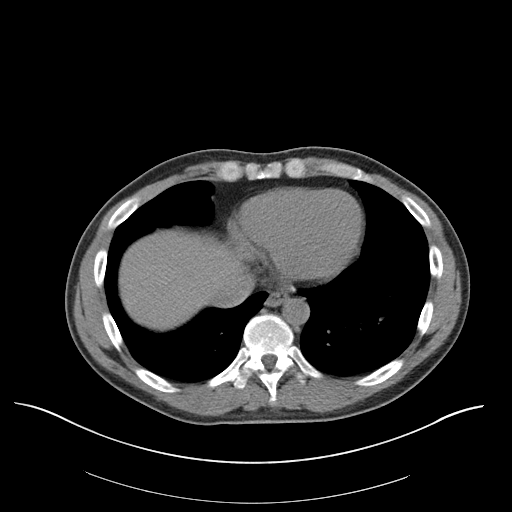

[Series 4: coronal · coronal · 0.94mm/px · 3 of 119 slices shown]
[im 40/119  soft-tissue]
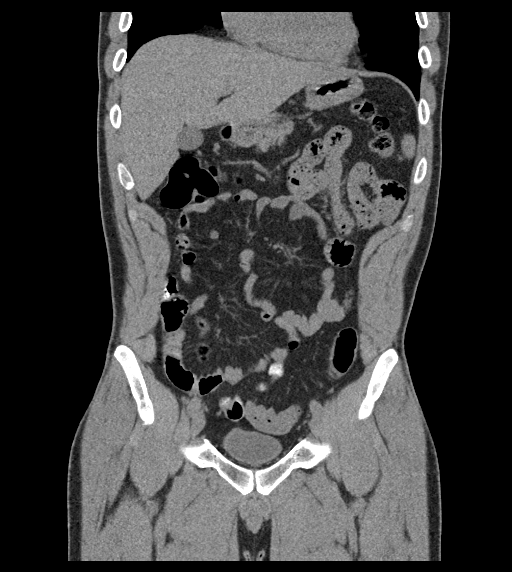
[im 53/119  soft-tissue]
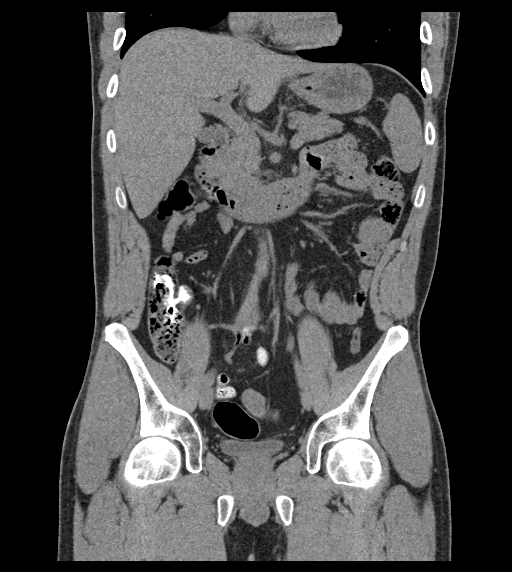
[im 66/119  soft-tissue]
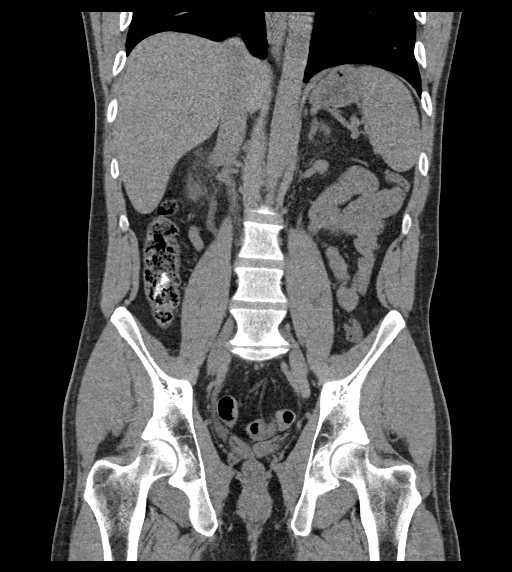

[16 of 46 positions shown; findings below may reference images not displayed]

FINDINGS: Lower chest: No acute abnormality.

Hepatobiliary: No solid liver abnormality is seen. No gallstones,
gallbladder wall thickening, or biliary dilatation.

Pancreas: Unremarkable. No pancreatic ductal dilatation or
surrounding inflammatory changes.

Spleen: Normal in size without significant abnormality.

Adrenals/Urinary Tract: Adrenal glands are unremarkable. There is a
punctuate calculus at the right ureterovesicular junction measuring
1-2 mm (series 2, image 82). Mild associated right hydronephrosis
and hydroureter. No other urinary tract calculi. Bladder is
unremarkable.

Stomach/Bowel: Stomach is within normal limits. Appendix appears
normal. No evidence of bowel wall thickening, distention, or
inflammatory changes.

Vascular/Lymphatic: No significant vascular findings are present. No
enlarged abdominal or pelvic lymph nodes.

Reproductive: No mass or other significant abnormality.

Other: No abdominal wall hernia or abnormality. No abdominopelvic
ascites.

Musculoskeletal: No acute or significant osseous findings.
IMPRESSION: 1. There is a punctuate calculus at the right ureterovesicular
junction measuring 1-2 mm (series 2, image 82). Mild associated
right hydronephrosis and hydroureter. No other urinary tract
calculi.

2.  Normal appendix.

## 2021-07-26 ENCOUNTER — Telehealth: Payer: Self-pay

## 2021-07-26 NOTE — Telephone Encounter (Signed)
Yes, I have agreed to see him.  Okay to schedule new patient appointment with me.  Thanks.

## 2021-07-26 NOTE — Telephone Encounter (Signed)
Patient stated Dr Richard Olson gave him authorization to become a new patient of his - Patient aware Dr Neva Seat is not taking in new patients.   Please advise approval .-

## 2021-09-16 ENCOUNTER — Ambulatory Visit (INDEPENDENT_AMBULATORY_CARE_PROVIDER_SITE_OTHER): Payer: Commercial Managed Care - PPO | Admitting: Family Medicine

## 2021-09-16 ENCOUNTER — Encounter: Payer: Self-pay | Admitting: Family Medicine

## 2021-09-16 VITALS — BP 136/84 | HR 70 | Temp 98.0°F | Ht 73.0 in | Wt 193.4 lb

## 2021-09-16 DIAGNOSIS — M1A0790 Idiopathic chronic gout, unspecified ankle and foot, without tophus (tophi): Secondary | ICD-10-CM | POA: Diagnosis not present

## 2021-09-16 DIAGNOSIS — Z13220 Encounter for screening for lipoid disorders: Secondary | ICD-10-CM

## 2021-09-16 DIAGNOSIS — Z131 Encounter for screening for diabetes mellitus: Secondary | ICD-10-CM | POA: Diagnosis not present

## 2021-09-16 DIAGNOSIS — Z0000 Encounter for general adult medical examination without abnormal findings: Secondary | ICD-10-CM

## 2021-09-16 DIAGNOSIS — Z23 Encounter for immunization: Secondary | ICD-10-CM

## 2021-09-16 MED ORDER — COLCHICINE 0.6 MG PO TABS: ORAL_TABLET | ORAL | 3 refills | Status: DC

## 2021-09-16 MED ORDER — INDOMETHACIN 50 MG PO CAPS: ORAL_CAPSULE | ORAL | 6 refills | Status: DC

## 2021-09-16 NOTE — Patient Instructions (Addendum)
Clarify the cancer history in family and if colon cancer then may need earlier colon cancer screening. Keep up the good work with cutting back on soda and exercise.  Schedule appointment with dentist.    Keep a record of your blood pressures outside of the office and if consistently over 130/80 - follow up to discuss further. Try to avoid high salt foods.    Preventive Care 65-44 Years Old, Male Preventive care refers to lifestyle choices and visits with your health care provider that can promote health and wellness. Preventive care visits are also called wellness exams. What can I expect for my preventive care visit? Counseling During your preventive care visit, your health care provider may ask about your: Medical history, including: Past medical problems. Family medical history. Current health, including: Emotional well-being. Home life and relationship well-being. Sexual activity. Lifestyle, including: Alcohol, nicotine or tobacco, and drug use. Access to firearms. Diet, exercise, and sleep habits. Safety issues such as seatbelt and bike helmet use. Sunscreen use. Work and work Astronomer. Physical exam Your health care provider will check your: Height and weight. These may be used to calculate your BMI (body mass index). BMI is a measurement that tells if you are at a healthy weight. Waist circumference. This measures the distance around your waistline. This measurement also tells if you are at a healthy weight and may help predict your risk of certain diseases, such as type 2 diabetes and high blood pressure. Heart rate and blood pressure. Body temperature. Skin for abnormal spots. What immunizations do I need?  Vaccines are usually given at various ages, according to a schedule. Your health care provider will recommend vaccines for you based on your age, medical history, and lifestyle or other factors, such as travel or where you work. What tests do I need? Screening Your  health care provider may recommend screening tests for certain conditions. This may include: Lipid and cholesterol levels. Diabetes screening. This is done by checking your blood sugar (glucose) after you have not eaten for a while (fasting). Hepatitis B test. Hepatitis C test. HIV (human immunodeficiency virus) test. STI (sexually transmitted infection) testing, if you are at risk. Lung cancer screening. Prostate cancer screening. Colorectal cancer screening. Talk with your health care provider about your test results, treatment options, and if necessary, the need for more tests. Follow these instructions at home: Eating and drinking  Eat a diet that includes fresh fruits and vegetables, whole grains, lean protein, and low-fat dairy products. Take vitamin and mineral supplements as recommended by your health care provider. Do not drink alcohol if your health care provider tells you not to drink. If you drink alcohol: Limit how much you have to 0-2 drinks a day. Know how much alcohol is in your drink. In the [place] one drink equals one 12 oz bottle of beer (355 mL), one 5 oz glass of wine (148 mL), or one 1 oz glass of hard liquor (44 mL). Lifestyle Brush your teeth every morning and night with fluoride toothpaste. Floss one time each day. Exercise for at least 30 minutes 5 or more days each week. Do not use any products that contain nicotine or tobacco. These products include cigarettes, chewing tobacco, and vaping devices, such as e-cigarettes. If you need help quitting, ask your health care provider. Do not use drugs. If you are sexually active, practice safe sex. Use a condom or other form of protection to prevent STIs. Take aspirin only as told by your health care provider.  Make sure that you understand how much to take and what form to take. Work with your health care provider to find out whether it is safe and beneficial for you to take aspirin daily. Find healthy ways to manage  stress, such as: Meditation, yoga, or listening to music. Journaling. Talking to a trusted person. Spending time with friends and family. Minimize exposure to UV radiation to reduce your risk of skin cancer. Safety Always wear your seat belt while driving or riding in a vehicle. Do not drive: If you have been drinking alcohol. Do not ride with someone who has been drinking. When you are tired or distracted. While texting. If you have been using any mind-altering substances or drugs. Wear a helmet and other protective equipment during sports activities. If you have firearms in your house, make sure you follow all gun safety procedures. What's next? Go to your health care provider once a year for an annual wellness visit. Ask your health care provider how often you should have your eyes and teeth checked. Stay up to date on all vaccines. This information is not intended to replace advice given to you by your health care provider. Make sure you discuss any questions you have with your health care provider. Document Revised: 06/17/2020 Document Reviewed: 06/17/2020 [employer] Patient Education  [place].

## 2021-09-16 NOTE — Progress Notes (Unsigned)
Subjective:  Patient ID: Richard Olson, male    DOB: March 28, 1975  Age: 46 y.o. MRN: [id number]  CC:  Chief Complaint  Patient presents with   Annual Exam    Pt states all is well    HPI Richard Olson presents for Annual Exam And new patient to establish care. No recent PCP.  Manages call center, [employer].  Rosalee Kaufman of one of my prior coworkers [employer].   History of nephrolithiasis, ER visit [id number], ureteral colic.  Right ureteral stone at that time.  Also with history of gout, ER visit December 2022, blood pressure elevated at that time. No recurrence. No recent hematuria. Single episode prior, no stone analysis.  Gout: Last flare: few months ago. Flare every few months in feet. Cutting back on soda. Some weight gain past year  working at home.  Daily meds:none.  Prn med: colchicine, indomethacin, prednisone prior.  Lab Results  Component Value Date   LABURIC 7.4 07/14/2015       09/16/2021    1:55 PM 07/14/2015    9:02 AM 11/18/2014    5:35 PM  Depression screen PHQ 2/9  Decreased Interest 0 0 0  Down, Depressed, Hopeless 0 0 0  PHQ - 2 Score 0 0 0  Altered sleeping 0    Tired, decreased energy 0    Change in appetite 0    Feeling bad or failure about yourself  0    Trouble concentrating 0    Moving slowly or fidgety/restless 0    Suicidal thoughts 0    PHQ-9 Score 0      Health Maintenance  Topic Date Due   COVID-19 Vaccine (1) 10/02/2021 (Originally 05/26/1976)   INFLUENZA VACCINE  04/03/2022 (Originally 08/03/2021)   TETANUS/TDAP  09/17/2022 (Originally 11/25/1996)   Hepatitis C Screening  09/17/2022 (Originally 11/26/1995)   HIV Screening  09/17/2022 (Originally 11/25/1992)   HPV VACCINES  Aged Out  Father with diabetes, heart disease, with recent 3v CABG.  Uncle with colon CA possibly.  Prostate: does NOT have family history of prostate cancer The natural history of prostate cancer and ongoing controversy regarding screening and potential  treatment outcomes of prostate cancer has been discussed with the patient. The meaning of a false positive PSA and a false negative PSA has been discussed. He indicates understanding of the limitations of this screening test and wishes NOT. to proceed with screening PSA testing. No results found for: "PSA1", "PSA"  There is no immunization history for the selected administration types on file for this patient. Flu vaccine: today.    No results found. No corrective lenses. No eye issues.   Dental: due.   Alcohol: once per month.   Tobacco: none  Exercise:running 4-5 times per week. 6-12 miles.    History Patient Active Problem List   Diagnosis Date Noted   Gout 01/06/2014   Past Medical History:  Diagnosis Date   Gout    Past Surgical History:  Procedure Laterality Date   SCROTUM EXPLORATION     ascending testicle   No Known Allergies Prior to Admission medications   Medication Sig Start Date End Date Taking? Authorizing Provider  colchicine (COLCRYS) 0.6 MG tablet Please begin with 2 tablets, then 1 tablet 1 hour later. Repeat on day 2 if needed. Patient not taking: Reported on 09/16/2021 12/21/20   Geryl Councilman L, PA  HYDROcodone-acetaminophen (NORCO/VICODIN) 5-325 MG tablet Take 1 tablet by mouth every 4 (four) hours as needed. Patient not taking:  Reported on 09/16/2021 04/28/19   Mancel Bale, MD  indomethacin (INDOCIN) 50 MG capsule Take one capsule PO with food three times daily as needed AFTER completing prednisone pack Patient not taking: Reported on 09/16/2021 12/21/20   Guy Sandifer L, PA  predniSONE (STERAPRED UNI-PAK 21 TAB) 10 MG (21) TBPK tablet Take by mouth daily. Take 6 tabs by mouth daily  for 1 days, then 5 tabs for 1 days, then 4 tabs for 1 days, then 3 tabs for 1 days, 2 tabs for 1 days, then 1 tab by mouth daily for 1 days Patient not taking: Reported on 09/16/2021 12/21/20   Maretta Bees, PA  tamsulosin (FLOMAX) 0.4 MG CAPS capsule 1 q HS to aid  stone passage Patient not taking: Reported on 09/16/2021 04/28/19   Mancel Bale, MD   Social History   Socioeconomic History   Marital status: Married    Spouse name: Not on file   Number of children: Not on file   Years of education: Not on file   Highest education level: Not on file  Occupational History   Not on file  Tobacco Use   Smoking status: Never   Smokeless tobacco: Never  Substance and Sexual Activity   Alcohol use: Yes    Alcohol/week: 0.0 standard drinks of alcohol    Comment: 3 drinks a week   Drug use: No   Sexual activity: Not on file  Other Topics Concern   Not on file  Social History Narrative   Not on file   Social Determinants of Health   Financial Resource Strain: Not on file  Food Insecurity: Not on file  Transportation Needs: Not on file  Physical Activity: Not on file  Stress: Not on file  Social Connections: Not on file  Intimate Partner Violence: Not on file    Review of Systems 13 point review of systems per patient health survey noted.  Negative other than as indicated above or in HPI.    Objective:   Vitals:   09/16/21 1356  BP: 136/84  Pulse: 70  Temp: 98 F (36.7 C)  SpO2: 97%  Weight: 193 lb 6.4 oz (87.7 kg)  Height: 6\' 1"  (1.854 m)   {Vitals History (Optional):23777}  Physical Exam Vitals reviewed.  Constitutional:      Appearance: He is well-developed.  HENT:     Head: Normocephalic and atraumatic.     Right Ear: External ear normal.     Left Ear: External ear normal.  Eyes:     Conjunctiva/sclera: Conjunctivae normal.     Pupils: Pupils are equal, round, and reactive to light.  Neck:     Thyroid: No thyromegaly.  Cardiovascular:     Rate and Rhythm: Normal rate and regular rhythm.     Heart sounds: Normal heart sounds.  Pulmonary:     Effort: Pulmonary effort is normal. No respiratory distress.     Breath sounds: Normal breath sounds. No wheezing.  Abdominal:     General: There is no distension.      Palpations: Abdomen is soft.     Tenderness: There is no abdominal tenderness.  Musculoskeletal:        General: No tenderness. Normal range of motion.     Cervical back: Normal range of motion and neck supple.  Lymphadenopathy:     Cervical: No cervical adenopathy.  Skin:    General: Skin is warm and dry.  Neurological:     Mental Status: He is alert and oriented  to person, place, and time.     Deep Tendon Reflexes: Reflexes are normal and symmetric.  Psychiatric:        Behavior: Behavior normal.        Assessment & Plan:  Richard Olson is a 46 y.o. male . Annual physical exam - Plan: Comprehensive metabolic panel, Lipid panel  Flu vaccine need - Plan: Flu Vaccine QUAD 54mo+IM (Fluarix, Fluzone & Alfiuria Quad PF)  Need for Tdap vaccination - Plan: Tdap vaccine greater than or equal to 7yo IM  Chronic gout of foot, unspecified cause, unspecified laterality - Plan: Uric acid, colchicine (COLCRYS) 0.6 MG tablet, indomethacin (INDOCIN) 50 MG capsule  Screening for diabetes mellitus - Plan: Comprehensive metabolic panel  Screening for hyperlipidemia - Plan: Lipid panel  -anticipatory guidance as below in AVS, screening labs above. Health maintenance items as above in HPI discussed/recommended as applicable.  -History of gout, infrequent flares, dietary changes planned as above.  Check uric acid and if frequent flares recommend consideration of allopurinol, especially if uric acid above 6.  -Screening labs as above, has increased exercise.  Home monitoring of blood pressure discussed as well as salty food avoidance, handout given on salty 6.  RTC precautions. Tdap, flu vaccines given.   Meds ordered this encounter  Medications   colchicine (COLCRYS) 0.6 MG tablet    Sig: Please begin with 2 tablets, then 1 tablet 1 hour later. Repeat on day 2 if needed.    Dispense:  12 tablet    Refill:  3   indomethacin (INDOCIN) 50 MG capsule    Sig: Take one capsule PO with food three  times daily as needed AFTER completing prednisone pack    Dispense:  30 capsule    Refill:  6   Patient Instructions  Clarify the cancer history in family and if colon cancer then may need earlier colon cancer screening. Keep up the good work with cutting back on soda and exercise.  Schedule appointment with dentist.    Keep a record of your blood pressures outside of the office and if consistently over 130/80 - follow up to discuss further. Try to avoid high salt foods.    Preventive Care 92-41 Years Old, Male Preventive care refers to lifestyle choices and visits with your health care provider that can promote health and wellness. Preventive care visits are also called wellness exams. What can I expect for my preventive care visit? Counseling During your preventive care visit, your health care provider may ask about your: Medical history, including: Past medical problems. Family medical history. Current health, including: Emotional well-being. Home life and relationship well-being. Sexual activity. Lifestyle, including: Alcohol, nicotine or tobacco, and drug use. Access to firearms. Diet, exercise, and sleep habits. Safety issues such as seatbelt and bike helmet use. Sunscreen use. Work and work Astronomer. Physical exam Your health care provider will check your: Height and weight. These may be used to calculate your BMI (body mass index). BMI is a measurement that tells if you are at a healthy weight. Waist circumference. This measures the distance around your waistline. This measurement also tells if you are at a healthy weight and may help predict your risk of certain diseases, such as type 2 diabetes and high blood pressure. Heart rate and blood pressure. Body temperature. Skin for abnormal spots. What immunizations do I need?  Vaccines are usually given at various ages, according to a schedule. Your health care provider will recommend vaccines for you based on your age,  medical history, and lifestyle or other factors, such as travel or where you work. What tests do I need? Screening Your health care provider may recommend screening tests for certain conditions. This may include: Lipid and cholesterol levels. Diabetes screening. This is done by checking your blood sugar (glucose) after you have not eaten for a while (fasting). Hepatitis B test. Hepatitis C test. HIV (human immunodeficiency virus) test. STI (sexually transmitted infection) testing, if you are at risk. Lung cancer screening. Prostate cancer screening. Colorectal cancer screening. Talk with your health care provider about your test results, treatment options, and if necessary, the need for more tests. Follow these instructions at home: Eating and drinking  Eat a diet that includes fresh fruits and vegetables, whole grains, lean protein, and low-fat dairy products. Take vitamin and mineral supplements as recommended by your health care provider. Do not drink alcohol if your health care provider tells you not to drink. If you drink alcohol: Limit how much you have to 0-2 drinks a day. Know how much alcohol is in your drink. In the [place] one drink equals one 12 oz bottle of beer (355 mL), one 5 oz glass of wine (148 mL), or one 1 oz glass of hard liquor (44 mL). Lifestyle Brush your teeth every morning and night with fluoride toothpaste. Floss one time each day. Exercise for at least 30 minutes 5 or more days each week. Do not use any products that contain nicotine or tobacco. These products include cigarettes, chewing tobacco, and vaping devices, such as e-cigarettes. If you need help quitting, ask your health care provider. Do not use drugs. If you are sexually active, practice safe sex. Use a condom or other form of protection to prevent STIs. Take aspirin only as told by your health care provider. Make sure that you understand how much to take and what form to take. Work with your health  care provider to find out whether it is safe and beneficial for you to take aspirin daily. Find healthy ways to manage stress, such as: Meditation, yoga, or listening to music. Journaling. Talking to a trusted person. Spending time with friends and family. Minimize exposure to UV radiation to reduce your risk of skin cancer. Safety Always wear your seat belt while driving or riding in a vehicle. Do not drive: If you have been drinking alcohol. Do not ride with someone who has been drinking. When you are tired or distracted. While texting. If you have been using any mind-altering substances or drugs. Wear a helmet and other protective equipment during sports activities. If you have firearms in your house, make sure you follow all gun safety procedures. What's next? Go to your health care provider once a year for an annual wellness visit. Ask your health care provider how often you should have your eyes and teeth checked. Stay up to date on all vaccines. This information is not intended to replace advice given to you by your health care provider. Make sure you discuss any questions you have with your health care provider. Document Revised: 06/17/2020 Document Reviewed: 06/17/2020 Elsevier Patient Education  [place],   Merri Ray, MD [place] 09/16/21 3:03 PM

## 2021-09-17 LAB — COMPREHENSIVE METABOLIC PANEL
ALT: 25 U/L (ref 0–53)
AST: 16 U/L (ref 0–37)
Albumin: 4.5 g/dL (ref 3.5–5.2)
Alkaline Phosphatase: 43 U/L (ref 39–117)
BUN: 11 mg/dL (ref 6–23)
CO2: 30 mEq/L (ref 19–32)
Calcium: 9.6 mg/dL (ref 8.4–10.5)
Chloride: 104 mEq/L (ref 96–112)
Creatinine, Ser: 1.32 mg/dL (ref 0.40–1.50)
GFR: 65.92 mL/min (ref >60.00)
Glucose, Bld: 101 mg/dL — ABNORMAL HIGH (ref 70–99)
Potassium: 5.3 mEq/L — ABNORMAL HIGH (ref 3.5–5.1)
Sodium: 140 mEq/L (ref 135–145)
Total Bilirubin: 1.3 mg/dL — ABNORMAL HIGH (ref 0.2–1.2)
Total Protein: 7.3 g/dL (ref 6.0–8.3)

## 2021-09-17 LAB — LIPID PANEL
Cholesterol: 237 mg/dL — ABNORMAL HIGH (ref 0–200)
HDL: 46.6 mg/dL (ref >39.00)
NonHDL: 190.66
Total CHOL/HDL Ratio: 5
Triglycerides: 237 mg/dL — ABNORMAL HIGH (ref 0.0–149.0)
VLDL: 47.4 mg/dL — ABNORMAL HIGH (ref 0.0–40.0)

## 2021-09-17 LAB — URIC ACID: Uric Acid, Serum: 8.1 mg/dL — ABNORMAL HIGH (ref 4.0–7.8)

## 2021-09-17 LAB — LDL CHOLESTEROL, DIRECT: Direct LDL: 210 mg/dL

## 2021-09-25 ENCOUNTER — Other Ambulatory Visit: Payer: Self-pay | Admitting: Family Medicine

## 2021-09-25 DIAGNOSIS — E875 Hyperkalemia: Secondary | ICD-10-CM

## 2021-09-25 NOTE — Progress Notes (Signed)
See labs 

## 2022-08-18 ENCOUNTER — Encounter (INDEPENDENT_AMBULATORY_CARE_PROVIDER_SITE_OTHER): Payer: Self-pay

## 2022-09-21 ENCOUNTER — Encounter: Payer: Commercial Managed Care - PPO | Admitting: Family Medicine

## 2022-11-08 ENCOUNTER — Other Ambulatory Visit: Payer: Self-pay | Admitting: Family Medicine

## 2022-11-08 DIAGNOSIS — M1A0790 Idiopathic chronic gout, unspecified ankle and foot, without tophus (tophi): Secondary | ICD-10-CM

## 2023-12-08 ENCOUNTER — Other Ambulatory Visit: Payer: Self-pay | Admitting: Family Medicine

## 2023-12-08 DIAGNOSIS — M1A0790 Idiopathic chronic gout, unspecified ankle and foot, without tophus (tophi): Secondary | ICD-10-CM

## 2023-12-11 ENCOUNTER — Encounter: Payer: Self-pay | Admitting: Family Medicine

## 2023-12-11 ENCOUNTER — Ambulatory Visit: Admitting: Family Medicine

## 2023-12-11 VITALS — BP 132/88 | HR 90 | Temp 98.2°F | Resp 14 | Ht 73.0 in | Wt 179.0 lb

## 2023-12-11 DIAGNOSIS — E875 Hyperkalemia: Secondary | ICD-10-CM

## 2023-12-11 DIAGNOSIS — E785 Hyperlipidemia, unspecified: Secondary | ICD-10-CM

## 2023-12-11 DIAGNOSIS — M1A0790 Idiopathic chronic gout, unspecified ankle and foot, without tophus (tophi): Secondary | ICD-10-CM

## 2023-12-11 DIAGNOSIS — Z1211 Encounter for screening for malignant neoplasm of colon: Secondary | ICD-10-CM

## 2023-12-11 MED ORDER — COLCHICINE 0.6 MG PO TABS: ORAL_TABLET | ORAL | 3 refills | Status: AC

## 2023-12-11 MED ORDER — INDOMETHACIN 50 MG PO CAPS: ORAL_CAPSULE | ORAL | 6 refills | Status: AC

## 2023-12-11 MED ORDER — PREDNISONE 20 MG PO TABS: 40.0000 mg | ORAL_TABLET | Freq: Every day | ORAL | 0 refills | Status: AC

## 2023-12-11 NOTE — Progress Notes (Unsigned)
 Subjective:  Patient ID: Richard Olson, male    DOB: [birth year]  Age: 48 y.o. MRN: [id number]  CC:  Chief Complaint  Patient presents with   Medication Refill    Patient is here for a medication refill. Patient is having a gout flare in left foot. Sx started friday    HPI Richard Olson presents for   Gout: Last flare: current flare started 3 days ago - 1 indomethacin , out of colchicine . Left foot - outside to top of foot. Swollen. Pain into ankle  Flare 1-2 times per year or more. Usually takes indomethacin  if times of suspected flare - about once per month.  Daily meds:none.  Prn med: Colchicine , indomethacin  if needed.  Last visit in 2023.  Meds were refilled in November of last year.  Lab Results  Component Value Date   LABURIC 8.1 (H) 09/16/2021   Hyperlipidemia: Elevated readings at his last visit in September 2023.  Diet/exercise approach recommended initially with repeat testing.  Low ASCVD risk score at 2.7% at that time. Lab Results  Component Value Date   CHOL 237 (H) 09/16/2021   HDL 46.60 09/16/2021   LDLDIRECT 210.0 09/16/2021   TRIG 237.0 (H) 09/16/2021   CHOLHDL 5 09/16/2021   Lab Results  Component Value Date   ALT 25 09/16/2021   AST 16 09/16/2021   ALKPHOS 43 09/16/2021   BILITOT 1.3 (H) 09/16/2021   Hyperkalemia Potassium 5.3 on his labs in September 2023   Health maintenance Flu vaccine: declines.  Colon cancer screening  Screening options with colonoscopy versus Cologuard discussed. Discussed timing of repeat testing intervals if normal, as well as potential need for diagnostic Colonoscopy if positive Cologuard. Understanding expressed, and chose colonoscopy.   Hep C screening, HIV screening - declines.   History Patient Active Problem List   Diagnosis Date Noted   Gout 01/06/2014   Past Medical History:  Diagnosis Date   Gout    Past Surgical History:  Procedure Laterality Date   SCROTUM EXPLORATION     ascending testicle    No Known Allergies Prior to Admission medications   Medication Sig Start Date End Date Taking? Authorizing Provider  colchicine  0.6 MG tablet PLEASE BEGIN WITH 2 TABLETS, THEN 1 TABLET 1 HOUR LATER. REPEAT ON DAY 2 IF NEEDED. 11/09/22  Yes Levora Reyes SAUNDERS, MD  indomethacin  (INDOCIN ) 50 MG capsule TAKE ONE CAPSULE BY MOUTH WITH FOOD THREE TIMES DAILY AS NEEDED AFTER COMPLETING PREDNISONE  PACK 11/09/22  Yes Levora Reyes SAUNDERS, MD   Social History   Socioeconomic History   Marital status: Married    Spouse name: Not on file   Number of children: Not on file   Years of education: Not on file   Highest education level: Not on file  Occupational History   Not on file  Tobacco Use   Smoking status: Never   Smokeless tobacco: Never  Substance and Sexual Activity   Alcohol use: Yes    Alcohol/week: 0.0 standard drinks of alcohol    Comment: 3 drinks a week   Drug use: No   Sexual activity: Not on file  Other Topics Concern   Not on file  Social History Narrative   Not on file   Social Drivers of Health   Financial Resource Strain: Not on file  Food Insecurity: Not on file  Transportation Needs: Not on file  Physical Activity: Not on file  Stress: Not on file  Social Connections: Not on file  Intimate  Partner Violence: Not on file    Review of Systems   Objective:   Vitals:   12/11/23 1428  BP: 132/88  Pulse: 90  Resp: 14  Temp: 98.2 F (36.8 C)  TempSrc: Temporal  SpO2: 100%  Weight: 179 lb (81.2 kg)  Height: 6' 1 (1.854 m)     Physical Exam Vitals reviewed.  Constitutional:      Appearance: He is well-developed.  HENT:     Head: Normocephalic and atraumatic.  Neck:     Vascular: No carotid bruit or JVD.  Cardiovascular:     Rate and Rhythm: Normal rate and regular rhythm.     Heart sounds: Normal heart sounds. No murmur heard. Pulmonary:     Effort: Pulmonary effort is normal.     Breath sounds: Normal breath sounds. No rales.  Musculoskeletal:      Right lower leg: No edema.     Left lower leg: No edema.     Comments: L foot, ankle -soft tissue swelling, tender with light touch.  Slight warmth.  No open wounds.  Limited motion of ankle due to discomfort.  Skin:    General: Skin is warm and dry.  Neurological:     Mental Status: He is alert and oriented to person, place, and time.  Psychiatric:        Mood and Affect: Mood normal.        Assessment & Plan:  Richard Olson is a 48 y.o. male . Hyperlipidemia, unspecified hyperlipidemia type - Plan: Lipid panel  Chronic gout of foot, unspecified cause, unspecified laterality - Plan: predniSONE  (DELTASONE ) 20 MG tablet, colchicine  0.6 MG tablet, indomethacin  (INDOCIN ) 50 MG capsule, Uric acid  Hyperkalemia - Plan: Comprehensive metabolic panel with GFR  Screen for colon cancer - Plan: Ambulatory referral to Gastroenterology   Meds ordered this encounter  Medications   predniSONE  (DELTASONE ) 20 MG tablet    Sig: Take 2 tablets (40 mg total) by mouth daily with breakfast.    Dispense:  10 tablet    Refill:  0   colchicine  0.6 MG tablet    Sig: Please begin with 2 tablets, then 1 tablet 1 hour later. Repeat on day 2 if needed.    Dispense:  12 tablet    Refill:  3   indomethacin  (INDOCIN ) 50 MG capsule    Sig: TAKE ONE CAPSULE BY MOUTH WITH FOOD THREE TIMES DAILY AS NEEDED AFTER COMPLETING PREDNISONE  PACK    Dispense:  30 capsule    Refill:  6   Patient Instructions  Thank you for coming in today.  You can try the colchicine , indomethacin  if needed for the next day or so but if not improving by Wednesday printed prednisone  they can be taken instead.  Do not combine indomethacin  with prednisone .  Will follow-up in 1 month to decide on allopurinol or a daily medicine to lessen gout flares.  If there are any concerns on your bloodwork, I will let you know. Take care!   Gout  Gout is a condition that causes painful swelling of the joints. Gout is a type of inflammation of  the joints (arthritis). This condition is caused by having too much uric acid in the body. Uric acid is a chemical that forms when the body breaks down substances called purines. Purines are important for building body proteins. When the body has too much uric acid, sharp crystals can form and build up inside the joints. This causes pain and swelling. Gout attacks  can happen quickly and may be very painful (acute gout). Over time, the attacks can affect more joints and become more frequent (chronic gout). Gout can also cause uric acid to build up under the skin and inside the kidneys. What are the causes? This condition is caused by too much uric acid in your blood. This can happen because: Your kidneys do not remove enough uric acid from your blood. This is the most common cause. Your body makes too much uric acid. This can happen with some cancers and cancer treatments. It can also occur if your body is breaking down too many red blood cells (hemolytic anemia). You eat too many foods that are high in purines. These foods include organ meats and some seafood. Alcohol, especially beer, is also high in purines. A gout attack may be triggered by trauma or stress. What increases the risk? The following factors may make you more likely to develop this condition: Having a family history of gout. Being male and middle-aged. Being male and having gone through menopause. Taking certain medicines, including aspirin, cyclosporine, diuretics, levodopa, and niacin. Having an organ transplant. Having certain conditions, such as: Being obese. Lead poisoning. Kidney disease. A skin condition called psoriasis. Other factors include: Losing weight too quickly. Being dehydrated. Frequently drinking alcohol, especially beer. Frequently drinking beverages that are sweetened with a type of sugar called fructose. What are the signs or symptoms? An attack of acute gout happens quickly. It usually occurs in just  one joint. The most common place is the big toe. Attacks often start at night. Other joints that may be affected include joints of the feet, ankle, knee, fingers, wrist, or elbow. Symptoms of this condition may include: Severe pain. Warmth. Swelling. Stiffness. Tenderness. The affected joint may be very painful to touch. Shiny, red, or purple skin. Chills and fever. Chronic gout may cause symptoms more frequently. More joints may be involved. You may also have white or yellow lumps (tophi) on your hands or feet or in other areas near your joints. How is this diagnosed? This condition is diagnosed based on your symptoms, your medical history, and a physical exam. You may have tests, such as: Blood tests to measure uric acid levels. Removal of joint fluid with a thin needle (aspiration) to look for uric acid crystals. X-rays to look for joint damage. How is this treated? Treatment for this condition has two phases: treating an acute attack and preventing future attacks. Acute gout treatment may include medicines to reduce pain and swelling, including: NSAIDs, such as ibuprofen. Steroids. These are strong anti-inflammatory medicines that can be taken by mouth (orally) or injected into a joint. Colchicine . This medicine relieves pain and swelling when it is taken soon after an attack. It can be given by mouth or through an IV. Preventive treatment may include: Daily use of smaller doses of NSAIDs or colchicine . Use of a medicine that reduces uric acid levels in your blood, such as allopurinol. Changes to your diet. You may need to see a dietitian about what to eat and drink to prevent gout. Follow these instructions at home: During a gout attack  If directed, put ice on the affected area. To do this: Put ice in a plastic bag. Place a towel between your skin and the bag. Leave the ice on for 20 minutes, 2-3 times a day. Remove the ice if your skin turns bright red. This is very important. If  you cannot feel pain, heat, or cold, you have a  greater risk of damage to the area. Raise (elevate) the affected joint above the level of your heart as often as possible. Rest the joint as much as possible. If the affected joint is in your leg, you may be given crutches to use. Follow instructions from your health care provider about eating or drinking restrictions. Avoiding future gout attacks Follow a low-purine diet as told by your dietitian or health care provider. Avoid foods and drinks that are high in purines, including liver, kidney, anchovies, asparagus, herring, mushrooms, mussels, and beer. Maintain a healthy weight or lose weight if you are overweight. If you want to lose weight, talk with your health care provider. Do not lose weight too quickly. Start or maintain an exercise program as told by your health care provider. Eating and drinking Avoid drinking beverages that contain fructose. Drink enough fluids to keep your urine pale yellow. If you drink alcohol: Limit how much you have to: 0-1 drink a day for women who are not pregnant. 0-2 drinks a day for men. Know how much alcohol is in a drink. In the [place] one drink equals one 12 oz bottle of beer (355 mL), one 5 oz glass of wine (148 mL), or one 1 oz glass of hard liquor (44 mL). General instructions Take over-the-counter and prescription medicines only as told by your health care provider. Ask your health care provider if the medicine prescribed to you requires you to avoid driving or using machinery. Return to your normal activities as told by your health care provider. Ask your health care provider what activities are safe for you. Keep all follow-up visits. This is important. Where to find more information [employer]: www.niams.http://www.myers.net/ Contact a health care provider if you have: Another gout attack. Continuing symptoms of a gout attack after 10 days of treatment. Side effects from your  medicines. Chills or a fever. Burning pain when you urinate. Pain in your lower back or abdomen. Get help right away if you: Have severe or uncontrolled pain. Cannot urinate. Summary Gout is painful swelling of the joints caused by having too much uric acid in the body. The most common site for gout to occur is in the big toe, but it can affect other joints in the body. Medicines and dietary changes can help to prevent and treat gout attacks. This information is not intended to replace advice given to you by your health care provider. Make sure you discuss any questions you have with your health care provider. Document Revised: 09/23/2020 Document Reviewed: 09/23/2020 Elsevier Patient Education  2024 Elsevier Inc.     Signed,   Reyes Pines, MD [facility], [facility] 12/11/23 3:24 PM

## 2023-12-11 NOTE — Patient Instructions (Addendum)
 Thank you for coming in today.  You can try the colchicine , indomethacin  if needed for the next day or so but if not improving by Wednesday printed prednisone  they can be taken instead.  Do not combine indomethacin  with prednisone .  Will follow-up in 1 month to decide on allopurinol or a daily medicine to lessen gout flares.  If there are any concerns on your bloodwork, I will let you know. Take care!   Gout  Gout is a condition that causes painful swelling of the joints. Gout is a type of inflammation of the joints (arthritis). This condition is caused by having too much uric acid in the body. Uric acid is a chemical that forms when the body breaks down substances called purines. Purines are important for building body proteins. When the body has too much uric acid, sharp crystals can form and build up inside the joints. This causes pain and swelling. Gout attacks can happen quickly and may be very painful (acute gout). Over time, the attacks can affect more joints and become more frequent (chronic gout). Gout can also cause uric acid to build up under the skin and inside the kidneys. What are the causes? This condition is caused by too much uric acid in your blood. This can happen because: Your kidneys do not remove enough uric acid from your blood. This is the most common cause. Your body makes too much uric acid. This can happen with some cancers and cancer treatments. It can also occur if your body is breaking down too many red blood cells (hemolytic anemia). You eat too many foods that are high in purines. These foods include organ meats and some seafood. Alcohol, especially beer, is also high in purines. A gout attack may be triggered by trauma or stress. What increases the risk? The following factors may make you more likely to develop this condition: Having a family history of gout. Being male and middle-aged. Being male and having gone through menopause. Taking certain medicines,  including aspirin, cyclosporine, diuretics, levodopa, and niacin. Having an organ transplant. Having certain conditions, such as: Being obese. Lead poisoning. Kidney disease. A skin condition called psoriasis. Other factors include: Losing weight too quickly. Being dehydrated. Frequently drinking alcohol, especially beer. Frequently drinking beverages that are sweetened with a type of sugar called fructose. What are the signs or symptoms? An attack of acute gout happens quickly. It usually occurs in just one joint. The most common place is the big toe. Attacks often start at night. Other joints that may be affected include joints of the feet, ankle, knee, fingers, wrist, or elbow. Symptoms of this condition may include: Severe pain. Warmth. Swelling. Stiffness. Tenderness. The affected joint may be very painful to touch. Shiny, red, or purple skin. Chills and fever. Chronic gout may cause symptoms more frequently. More joints may be involved. You may also have white or yellow lumps (tophi) on your hands or feet or in other areas near your joints. How is this diagnosed? This condition is diagnosed based on your symptoms, your medical history, and a physical exam. You may have tests, such as: Blood tests to measure uric acid levels. Removal of joint fluid with a thin needle (aspiration) to look for uric acid crystals. X-rays to look for joint damage. How is this treated? Treatment for this condition has two phases: treating an acute attack and preventing future attacks. Acute gout treatment may include medicines to reduce pain and swelling, including: NSAIDs, such as ibuprofen. Steroids. These are  strong anti-inflammatory medicines that can be taken by mouth (orally) or injected into a joint. Colchicine . This medicine relieves pain and swelling when it is taken soon after an attack. It can be given by mouth or through an IV. Preventive treatment may include: Daily use of smaller doses  of NSAIDs or colchicine . Use of a medicine that reduces uric acid levels in your blood, such as allopurinol. Changes to your diet. You may need to see a dietitian about what to eat and drink to prevent gout. Follow these instructions at home: During a gout attack  If directed, put ice on the affected area. To do this: Put ice in a plastic bag. Place a towel between your skin and the bag. Leave the ice on for 20 minutes, 2-3 times a day. Remove the ice if your skin turns bright red. This is very important. If you cannot feel pain, heat, or cold, you have a greater risk of damage to the area. Raise (elevate) the affected joint above the level of your heart as often as possible. Rest the joint as much as possible. If the affected joint is in your leg, you may be given crutches to use. Follow instructions from your health care provider about eating or drinking restrictions. Avoiding future gout attacks Follow a low-purine diet as told by your dietitian or health care provider. Avoid foods and drinks that are high in purines, including liver, kidney, anchovies, asparagus, herring, mushrooms, mussels, and beer. Maintain a healthy weight or lose weight if you are overweight. If you want to lose weight, talk with your health care provider. Do not lose weight too quickly. Start or maintain an exercise program as told by your health care provider. Eating and drinking Avoid drinking beverages that contain fructose. Drink enough fluids to keep your urine pale yellow. If you drink alcohol: Limit how much you have to: 0-1 drink a day for women who are not pregnant. 0-2 drinks a day for men. Know how much alcohol is in a drink. In the [place] one drink equals one 12 oz bottle of beer (355 mL), one 5 oz glass of wine (148 mL), or one 1 oz glass of hard liquor (44 mL). General instructions Take over-the-counter and prescription medicines only as told by your health care provider. Ask your health care  provider if the medicine prescribed to you requires you to avoid driving or using machinery. Return to your normal activities as told by your health care provider. Ask your health care provider what activities are safe for you. Keep all follow-up visits. This is important. Where to find more information [employer]: www.niams.http://www.myers.net/ Contact a health care provider if you have: Another gout attack. Continuing symptoms of a gout attack after 10 days of treatment. Side effects from your medicines. Chills or a fever. Burning pain when you urinate. Pain in your lower back or abdomen. Get help right away if you: Have severe or uncontrolled pain. Cannot urinate. Summary Gout is painful swelling of the joints caused by having too much uric acid in the body. The most common site for gout to occur is in the big toe, but it can affect other joints in the body. Medicines and dietary changes can help to prevent and treat gout attacks. This information is not intended to replace advice given to you by your health care provider. Make sure you discuss any questions you have with your health care provider. Document Revised: 09/23/2020 Document Reviewed: 09/23/2020 Elsevier Patient Education  2024 Elsevier Inc.

## 2023-12-12 LAB — COMPREHENSIVE METABOLIC PANEL WITH GFR
ALT: 26 U/L (ref 0–53)
AST: 19 U/L (ref 0–37)
Albumin: 4.7 g/dL (ref 3.5–5.2)
Alkaline Phosphatase: 46 U/L (ref 39–117)
BUN: 15 mg/dL (ref 6–23)
CO2: 29 meq/L (ref 19–32)
Calcium: 9.7 mg/dL (ref 8.4–10.5)
Chloride: 101 meq/L (ref 96–112)
Creatinine, Ser: 1.11 mg/dL (ref 0.40–1.50)
GFR: 79.9 mL/min (ref >60.00)
Glucose, Bld: 110 mg/dL — ABNORMAL HIGH (ref 70–99)
Potassium: 4.8 meq/L (ref 3.5–5.1)
Sodium: 140 meq/L (ref 135–145)
Total Bilirubin: 1.2 mg/dL (ref 0.2–1.2)
Total Protein: 7.3 g/dL (ref 6.0–8.3)

## 2023-12-12 LAB — LIPID PANEL
Cholesterol: 201 mg/dL — ABNORMAL HIGH (ref 0–200)
HDL: 54.2 mg/dL (ref >39.00)
LDL Cholesterol: 126 mg/dL — ABNORMAL HIGH (ref 0–99)
NonHDL: 147.15
Total CHOL/HDL Ratio: 4
Triglycerides: 105 mg/dL (ref 0.0–149.0)
VLDL: 21 mg/dL (ref 0.0–40.0)

## 2023-12-12 LAB — URIC ACID: Uric Acid, Serum: 8.4 mg/dL — ABNORMAL HIGH (ref 4.0–7.8)

## 2023-12-16 ENCOUNTER — Ambulatory Visit: Payer: Self-pay | Admitting: Family Medicine

## 2023-12-26 NOTE — Progress Notes (Signed)
 Lab results have been discussed.   Verbalized understanding? Yes  Are there any questions? No

## 2024-02-02 ENCOUNTER — Ambulatory Visit: Payer: Self-pay | Admitting: Family Medicine

## 2024-02-02 ENCOUNTER — Encounter: Payer: Self-pay | Admitting: Family Medicine

## 2024-02-02 VITALS — BP 124/64 | HR 78 | Temp 98.1°F | Resp 14 | Ht 73.0 in | Wt 180.6 lb

## 2024-02-02 DIAGNOSIS — R739 Hyperglycemia, unspecified: Secondary | ICD-10-CM

## 2024-02-02 DIAGNOSIS — M1A0790 Idiopathic chronic gout, unspecified ankle and foot, without tophus (tophi): Secondary | ICD-10-CM | POA: Diagnosis not present

## 2024-02-02 LAB — POCT GLYCOSYLATED HEMOGLOBIN (HGB A1C): Hemoglobin A1C: 5.7 % — AB (ref 4.0–5.6)

## 2024-02-02 MED ORDER — ALLOPURINOL 100 MG PO TABS: 100.0000 mg | ORAL_TABLET | Freq: Every day | ORAL | 6 refills | Status: AC

## 2024-02-02 NOTE — Patient Instructions (Addendum)
 Thank you for coming in today.  With last uric acid level and recurrent gout lets start allopurinol  once per day.  As we discussed that does run the risk of flaring gout especially if you had a recent episode.  If you notice a flare of gout okay to take colchicine , indomethacin , or prednisone  if not improving after 4 to 5 days.  Stay on the allopurinol  once daily though.  If you are having difficulty drinking water throughout the day, try carrying a water bottle or setting alarm or reminder on your phone as that has been helpful for other patients. Please call gastroenterology to schedule the colon cancer screening when you are able.  Here is their number.  The referral has already been sent. [phone], [facility]. If there are any concerns on the blood sugar test I will let you know. Let me know if there are questions and take care.  I will see you in 6 months for physical and we can recheck some labs at that time.

## 2024-02-02 NOTE — Progress Notes (Signed)
 "  Subjective:  Patient ID: Richard Olson, male    DOB: 08/12/1975  Age: 49 y.o. MRN: [id number]  CC:  Chief Complaint  Patient presents with   Follow-up    Follow up in 1 month and plan for gout.     HPI Richard Olson presents for   Gout: Discussed in December.  Possible intermittent flares that he had treated with indomethacin .  Most recent flare in foot and ankle at that time, 3 to 4 days into symptoms.  Planned on initial colchicine , indomethacin  but then if not improving within a day or 2 to start prednisone  burst, short course with potential side effects.  We did discuss possible starting of allopurinol  based on uric acid level given likely recurrent slight flares and need for suppression.  Elevated uric acid noted at his last visit.  Colchicine  was effective for most recent flare - taking 2 per day for a week, and indomethacin , pain eventually went away. Did not use prednisone . Occasional sensation in area, but no pain/redness/swelling since attack.  Brothers, father with gout.   Lab Results  Component Value Date   LABURIC 8.4 (H) 12/11/2023   Hyperglycemia Glucose 110 in December, previously 101, 142. Plan for A1c today. No change in urination, no blurry vision.  No results found for: HGBA1C Father with diabetes.   Hyperlipidemia: Mild hyperlipidemia but low ASCVD risk score at 2.2% in December, continue diet/exercise approach. Lab Results  Component Value Date   CHOL 201 (H) 12/11/2023   HDL 54.20 12/11/2023   LDLCALC 126 (H) 12/11/2023   LDLDIRECT 210.0 09/16/2021   TRIG 105.0 12/11/2023   CHOLHDL 4 12/11/2023   Lab Results  Component Value Date   ALT 26 12/11/2023   AST 19 12/11/2023   ALKPHOS 46 12/11/2023   BILITOT 1.2 12/11/2023   HM: Referred for colonoscopy in 12/2023 - has not yet scheduled.   History Patient Active Problem List   Diagnosis Date Noted   Gout 01/06/2014   Past Medical History:  Diagnosis Date   Gout    Past Surgical  History:  Procedure Laterality Date   SCROTUM EXPLORATION     ascending testicle   Allergies[1] Prior to Admission medications  Medication Sig Start Date End Date Taking? Authorizing Provider  colchicine  0.6 MG tablet Please begin with 2 tablets, then 1 tablet 1 hour later. Repeat on day 2 if needed. Patient not taking: Reported on 02/02/2024 12/11/23   Levora Reyes SAUNDERS, MD  indomethacin  (INDOCIN ) 50 MG capsule TAKE ONE CAPSULE BY MOUTH WITH FOOD THREE TIMES DAILY AS NEEDED AFTER COMPLETING PREDNISONE  PACK Patient not taking: Reported on 02/02/2024 12/11/23   Levora Reyes SAUNDERS, MD  predniSONE  (DELTASONE ) 20 MG tablet Take 2 tablets (40 mg total) by mouth daily with breakfast. Patient not taking: Reported on 02/02/2024 12/11/23   Levora Reyes SAUNDERS, MD   Social History   Socioeconomic History   Marital status: Married    Spouse name: Not on file   Number of children: Not on file   Years of education: Not on file   Highest education level: Not on file  Occupational History   Not on file  Tobacco Use   Smoking status: Never   Smokeless tobacco: Never  Substance and Sexual Activity   Alcohol use: Yes    Alcohol/week: 0.0 standard drinks of alcohol    Comment: 3 drinks a week   Drug use: No   Sexual activity: Not on file  Other Topics Concern  Not on file  Social History Narrative   Not on file   Social Drivers of Health   Tobacco Use: Low Risk (02/02/2024)   Patient History    Smoking Tobacco Use: Never    Smokeless Tobacco Use: Never    Passive Exposure: Not on file  Financial Resource Strain: Not on file  Food Insecurity: Not on file  Transportation Needs: Not on file  Physical Activity: Not on file  Stress: Not on file  Social Connections: Not on file  Intimate Partner Violence: Not on file  Depression (PHQ2-9): Low Risk (02/02/2024)   Depression (PHQ2-9)    PHQ-2 Score: 0  Alcohol Screen: Not on file  Housing: Not on file  Utilities: Not on file  Health Literacy:  Not on file    Review of Systems Per HPI  Objective:   Vitals:   02/02/24 1013  BP: 124/64  Pulse: 78  Resp: 14  Temp: 98.1 F (36.7 C)  TempSrc: Temporal  SpO2: 98%  Weight: 180 lb 9.6 oz (81.9 kg)  Height: 6' 1 (1.854 m)     Physical Exam Vitals reviewed.  Constitutional:      Appearance: He is well-developed.  HENT:     Head: Normocephalic and atraumatic.  Neck:     Vascular: No carotid bruit or JVD.  Cardiovascular:     Rate and Rhythm: Normal rate and regular rhythm.     Heart sounds: Normal heart sounds. No murmur heard. Pulmonary:     Effort: Pulmonary effort is normal.     Breath sounds: Normal breath sounds. No rales.  Musculoskeletal:     Right lower leg: No edema.     Left lower leg: No edema.     Comments: No joint tenderness, feet nontender, ankles nontender.  Skin:    General: Skin is warm and dry.  Neurological:     Mental Status: He is alert and oriented to person, place, and time.  Psychiatric:        Mood and Affect: Mood normal.     Poct A1c 5.7   Assessment & Plan:  Richard Olson is a 49 y.o. male . Chronic gout of foot, unspecified cause, unspecified laterality - Plan: allopurinol  (ZYLOPRIM ) 100 MG tablet  - Recurrent flares previously.  Decided to start low-dose allopurinol  with potential side effects discussed, watch for flares of gout, has colchicine , indomethacin  if needed and prednisone  if symptoms present longer than 4 to 5 days.  Potential side effects of those meds discussed as well.  Recheck uric acid at 69-month follow-up for physical with RTC precautions given.  Hyperglycemia - Plan: POCT glycosylated hemoglobin (Hb A1C) Borderline A1c at 5.7, he does note there is some room for improvement with diet, he is exercising regularly.  Will continue to monitor with repeat labs in 6 months, no new meds or changes for now.  Meds ordered this encounter  Medications   allopurinol  (ZYLOPRIM ) 100 MG tablet    Sig: Take 1 tablet  (100 mg total) by mouth daily.    Dispense:  30 tablet    Refill:  6   Patient Instructions  Thank you for coming in today.  With last uric acid level and recurrent gout lets start allopurinol  once per day.  As we discussed that does run the risk of flaring gout especially if you had a recent episode.  If you notice a flare of gout okay to take colchicine , indomethacin , or prednisone  if not improving after 4 to 5 days.  Stay on the allopurinol  once daily though.  If you are having difficulty drinking water throughout the day, try carrying a water bottle or setting alarm or reminder on your phone as that has been helpful for other patients. Please call gastroenterology to schedule the colon cancer screening when you are able.  Here is their number.  The referral has already been sent. [phone], [facility]. If there are any concerns on the blood sugar test I will let you know. Let me know if there are questions and take care.  I will see you in 6 months for physical and we can recheck some labs at that time.    Signed,   Reyes Pines, MD [facility], [facility] 02/02/24 11:04 AM      [1] No Known Allergies  "

## 2024-02-05 ENCOUNTER — Encounter: Payer: Self-pay | Admitting: Gastroenterology
